# Patient Record
Sex: Male | Born: 1975
Health system: Southern US, Community
[De-identification: ages and names within clinical notes are randomized; demographics above are authoritative.]

## PROBLEM LIST (undated history)

## (undated) HISTORY — PX: BACK SURGERY: SHX140

## (undated) HISTORY — PX: ANKLE SURGERY: SHX546

---

## 2004-05-27 ENCOUNTER — Emergency Department: Payer: Self-pay | Admitting: Emergency Medicine

## 2008-01-11 ENCOUNTER — Inpatient Hospital Stay: Payer: Self-pay | Admitting: Internal Medicine

## 2008-01-11 ENCOUNTER — Other Ambulatory Visit: Payer: Self-pay

## 2009-03-21 ENCOUNTER — Emergency Department: Payer: Self-pay | Admitting: Unknown Physician Specialty

## 2009-05-09 ENCOUNTER — Emergency Department: Payer: Self-pay | Admitting: Emergency Medicine

## 2009-05-14 ENCOUNTER — Emergency Department: Payer: Self-pay

## 2009-06-03 ENCOUNTER — Emergency Department: Payer: Self-pay | Admitting: Unknown Physician Specialty

## 2009-07-09 ENCOUNTER — Emergency Department: Payer: Self-pay | Admitting: Emergency Medicine

## 2009-08-08 ENCOUNTER — Inpatient Hospital Stay: Payer: Self-pay | Admitting: Internal Medicine

## 2009-10-31 ENCOUNTER — Emergency Department: Payer: Self-pay | Admitting: Emergency Medicine

## 2010-01-28 ENCOUNTER — Emergency Department: Payer: Self-pay | Admitting: Emergency Medicine

## 2011-05-09 IMAGING — CR RIGHT HAND - COMPLETE 3+ VIEW
1 series · 3 of 3 positions shown · non-contrast
Comparison: none

REASON FOR EXAM: injury
COMMENTS:

PROCEDURE:     DXR - DXR HAND RT COMPLETE W/OBLIQUES  - May 09, 2009 [DATE]
RESULT:     Images of the right hand show no fracture, dislocation or
radiopaque foreign body.

[Series 1: view not recorded · 0.17mm/px · 3 of 3 slices shown]
[im 1/3]
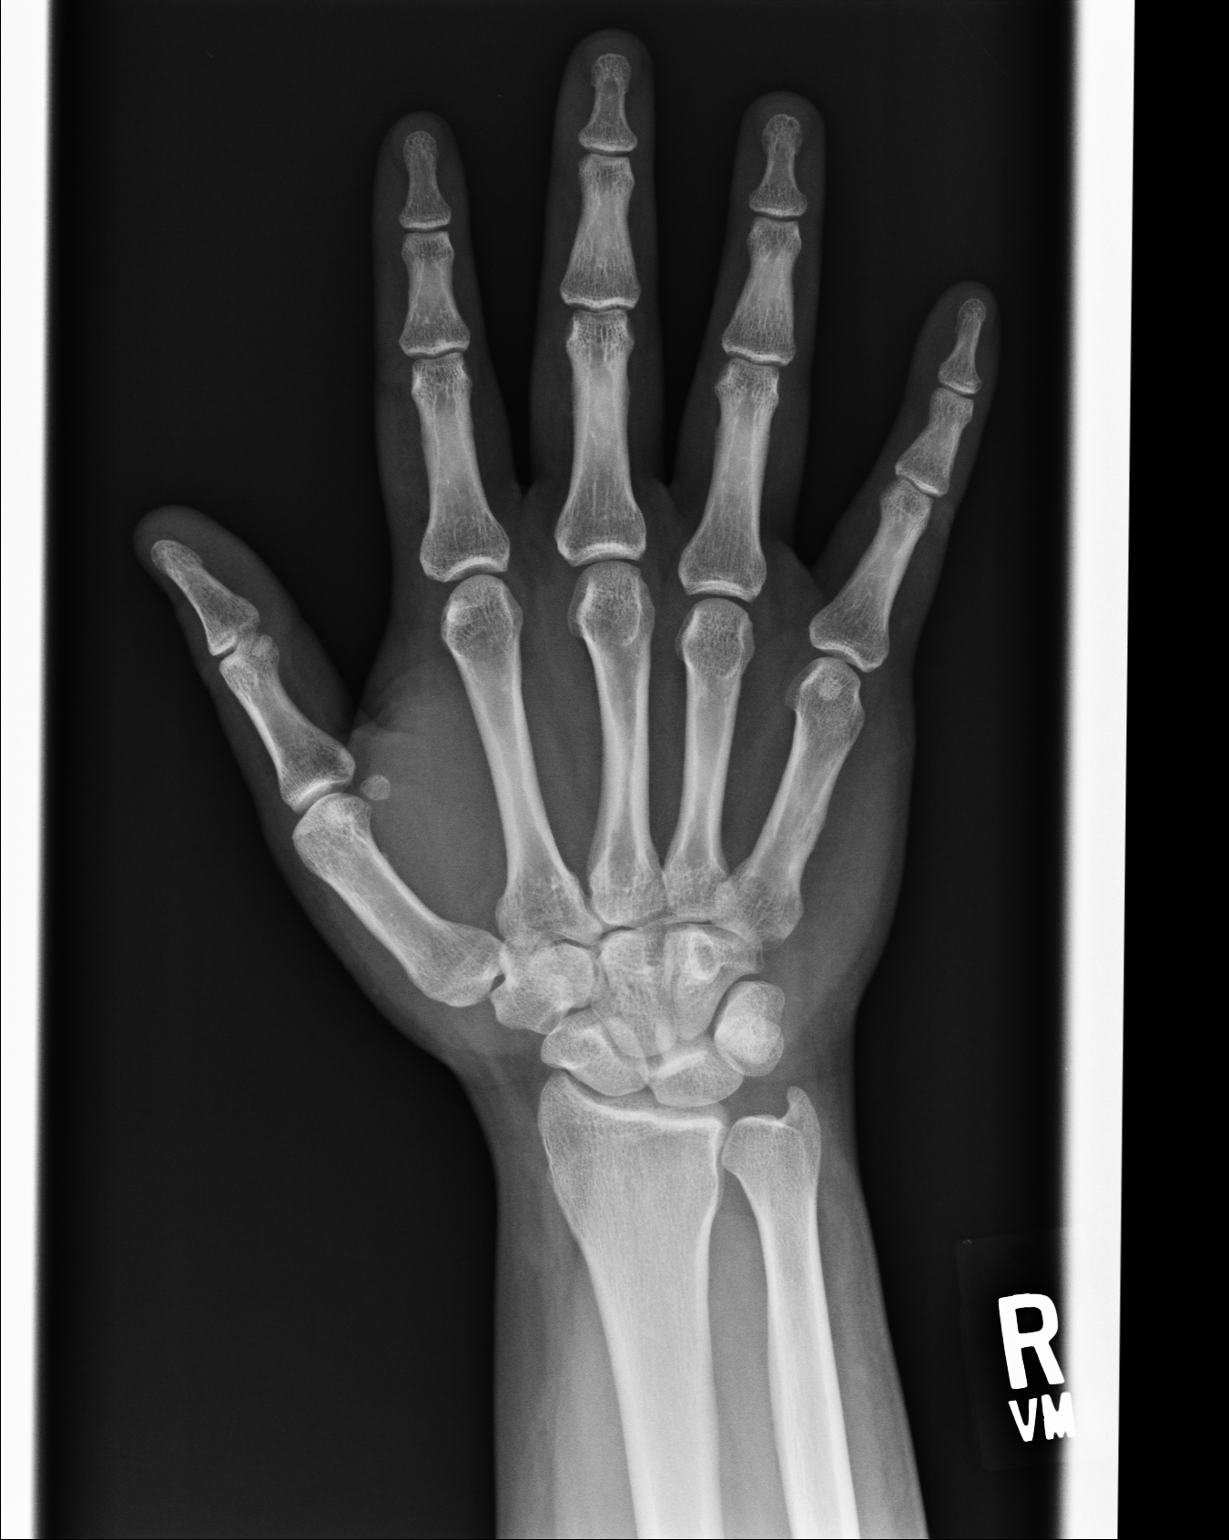
[im 2/3]
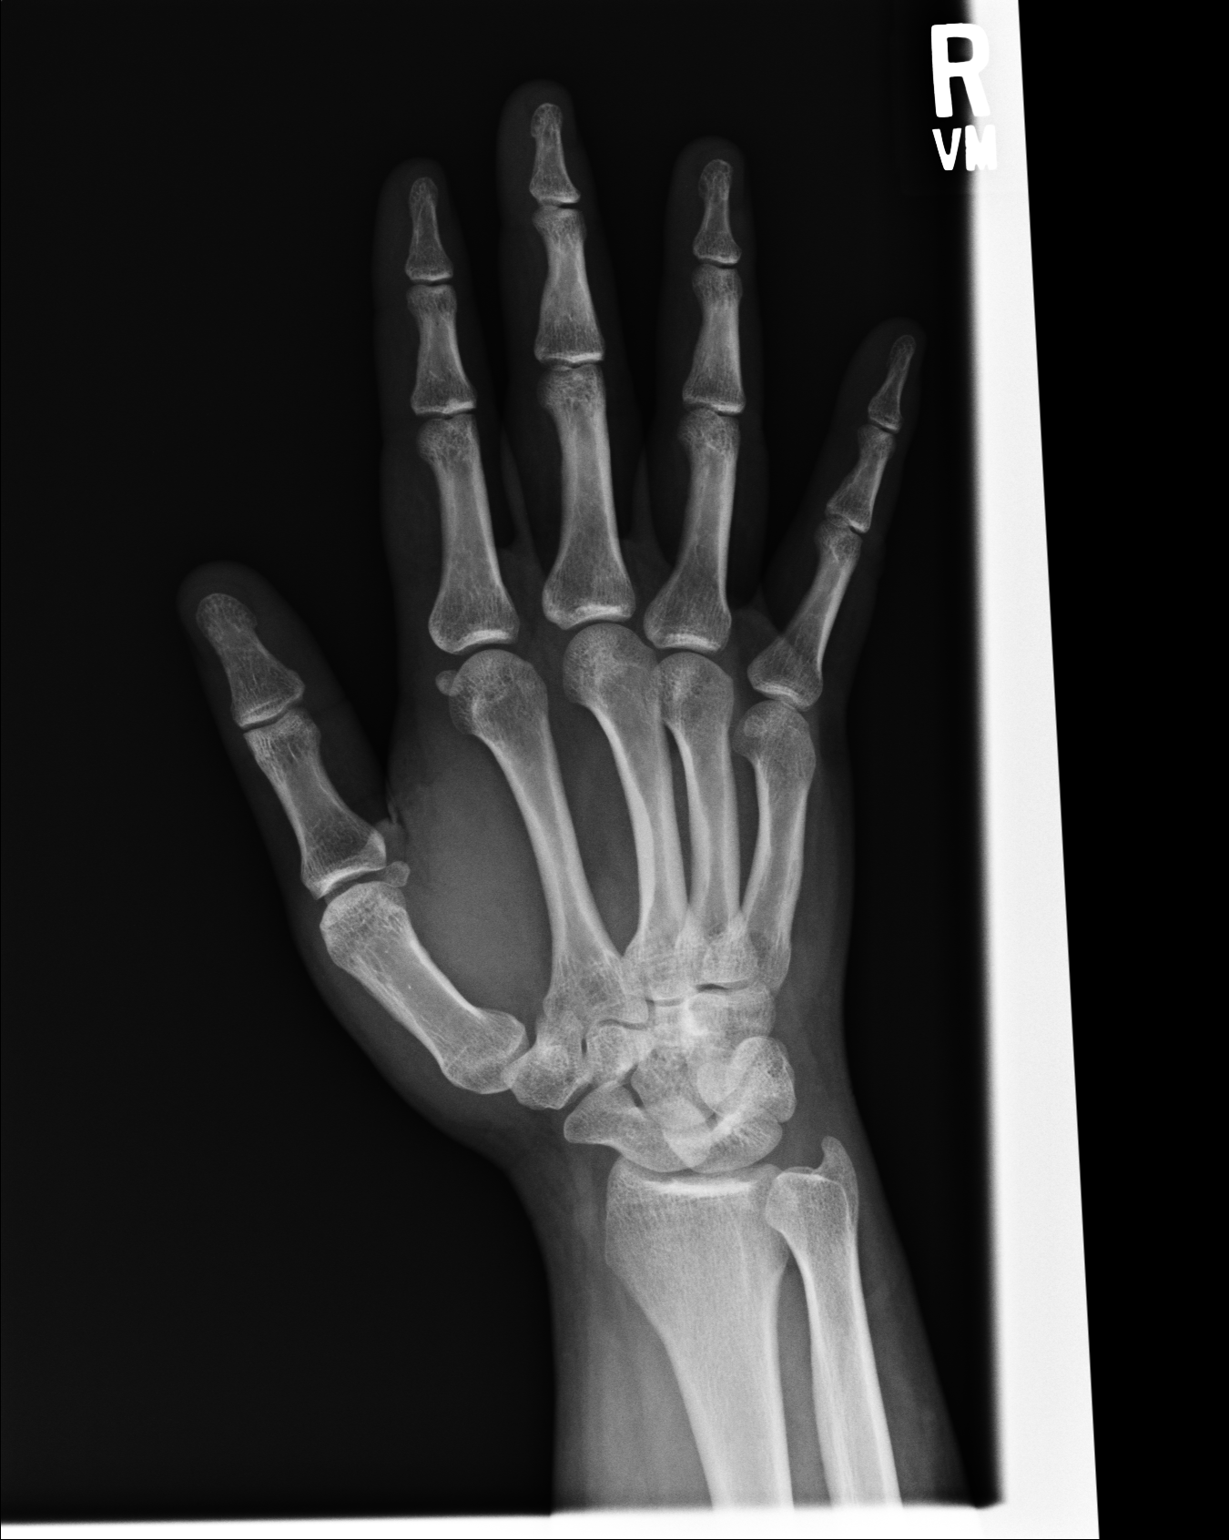
[im 3/3]
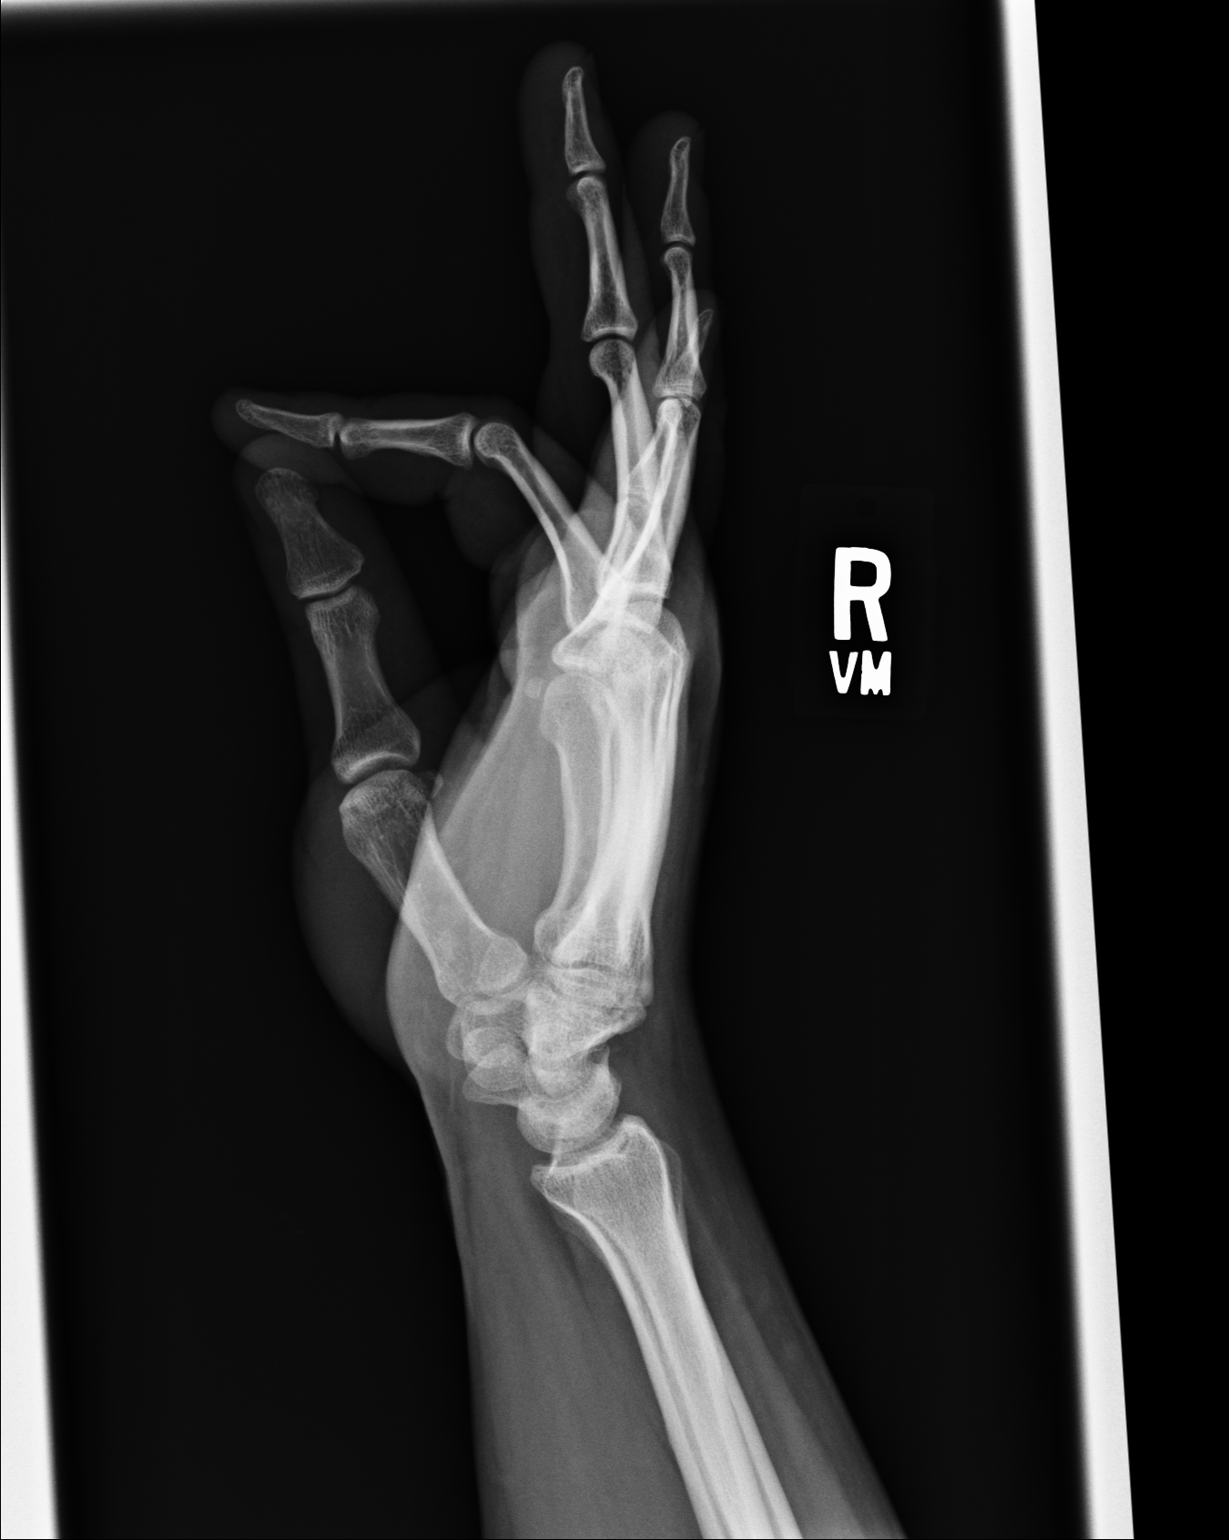

[3 of 3 positions shown; findings below may reference images not displayed]

IMPRESSION: Please see above.

## 2011-08-21 ENCOUNTER — Emergency Department: Payer: Self-pay | Admitting: Emergency Medicine

## 2011-11-26 LAB — DRUG SCREEN, URINE
Amphetamines, Ur Screen: NEGATIVE (ref ?–1000)
Barbiturates, Ur Screen: NEGATIVE (ref ?–200)
Cannabinoid 50 Ng, Ur ~~LOC~~: POSITIVE (ref ?–50)
Cocaine Metabolite,Ur ~~LOC~~: POSITIVE (ref ?–300)
MDMA (Ecstasy)Ur Screen: NEGATIVE (ref ?–500)
Methadone, Ur Screen: NEGATIVE (ref ?–300)
Opiate, Ur Screen: NEGATIVE (ref ?–300)
Phencyclidine (PCP) Ur S: NEGATIVE (ref ?–25)

## 2011-11-26 LAB — COMPREHENSIVE METABOLIC PANEL
Albumin: 4.1 g/dL (ref 3.4–5.0)
Alkaline Phosphatase: 60 U/L (ref 50–136)
Anion Gap: 11 (ref 7–16)
Bilirubin,Total: 0.6 mg/dL (ref 0.2–1.0)
Calcium, Total: 8.7 mg/dL (ref 8.5–10.1)
Creatinine: 1.15 mg/dL (ref 0.60–1.30)
EGFR (African American): >60
EGFR (Non-African Amer.): >60
SGPT (ALT): 44 U/L

## 2011-11-26 LAB — CBC
HGB: 14.4 g/dL (ref 13.0–18.0)
MCH: 28.6 pg (ref 26.0–34.0)
MCHC: 32.9 g/dL (ref 32.0–36.0)
MCV: 87 fL (ref 80–100)
RDW: 13.7 % (ref 11.5–14.5)

## 2011-11-26 LAB — ETHANOL: Ethanol %: <0.003 % (ref 0.000–0.080)

## 2011-11-26 LAB — TSH: Thyroid Stimulating Horm: 0.76 u[IU]/mL

## 2011-11-27 ENCOUNTER — Inpatient Hospital Stay: Payer: Self-pay | Admitting: Psychiatry

## 2013-03-29 ENCOUNTER — Emergency Department: Payer: Self-pay | Admitting: Emergency Medicine

## 2013-04-05 ENCOUNTER — Emergency Department: Payer: Self-pay | Admitting: Emergency Medicine

## 2013-04-05 LAB — CBC
HCT: 37.7 % — ABNORMAL LOW (ref 40.0–52.0)
HGB: 13 g/dL (ref 13.0–18.0)
MCHC: 34.4 g/dL (ref 32.0–36.0)
RBC: 4.44 10*6/uL (ref 4.40–5.90)
WBC: 10.1 10*3/uL (ref 3.8–10.6)

## 2013-04-05 LAB — BASIC METABOLIC PANEL
Anion Gap: 4 — ABNORMAL LOW (ref 7–16)
Calcium, Total: 8.8 mg/dL (ref 8.5–10.1)
Co2: 28 mmol/L (ref 21–32)
EGFR (Non-African Amer.): >60
Glucose: 86 mg/dL (ref 65–99)
Osmolality: 281 (ref 275–301)
Potassium: 3.7 mmol/L (ref 3.5–5.1)
Sodium: 141 mmol/L (ref 136–145)

## 2013-07-29 ENCOUNTER — Emergency Department: Payer: Self-pay | Admitting: Emergency Medicine

## 2013-07-30 ENCOUNTER — Emergency Department: Payer: Self-pay | Admitting: Emergency Medicine

## 2013-12-28 ENCOUNTER — Emergency Department: Payer: Self-pay | Admitting: Emergency Medicine

## 2013-12-31 ENCOUNTER — Emergency Department: Payer: Self-pay | Admitting: Internal Medicine

## 2014-01-08 ENCOUNTER — Emergency Department: Payer: Self-pay | Admitting: Emergency Medicine

## 2014-08-17 ENCOUNTER — Emergency Department: Payer: Self-pay | Admitting: Emergency Medicine

## 2014-08-17 LAB — URINALYSIS, COMPLETE
BLOOD: NEGATIVE
Bilirubin,UR: NEGATIVE
Glucose,UR: NEGATIVE mg/dL (ref 0–75)
Ketone: NEGATIVE
NITRITE: NEGATIVE
PH: 7 (ref 4.5–8.0)
Protein: NEGATIVE
RBC,UR: 7 /HPF (ref 0–5)
SPECIFIC GRAVITY: 1.018 (ref 1.003–1.030)
Squamous Epithelial: 13
WBC UR: 30 /HPF (ref 0–5)

## 2014-08-17 LAB — DRUG SCREEN, URINE
AMPHETAMINES, UR SCREEN: NEGATIVE (ref ?–1000)
Barbiturates, Ur Screen: NEGATIVE (ref ?–200)
Benzodiazepine, Ur Scrn: POSITIVE (ref ?–200)
Cannabinoid 50 Ng, Ur ~~LOC~~: POSITIVE (ref ?–50)
Cocaine Metabolite,Ur ~~LOC~~: NEGATIVE (ref ?–300)
MDMA (ECSTASY) UR SCREEN: NEGATIVE (ref ?–500)
Methadone, Ur Screen: NEGATIVE (ref ?–300)
Opiate, Ur Screen: NEGATIVE (ref ?–300)
Phencyclidine (PCP) Ur S: NEGATIVE (ref ?–25)
TRICYCLIC, UR SCREEN: NEGATIVE (ref ?–1000)

## 2014-08-17 LAB — COMPREHENSIVE METABOLIC PANEL
ALK PHOS: 77 U/L
Albumin: 3.4 g/dL (ref 3.4–5.0)
Anion Gap: 7 (ref 7–16)
BILIRUBIN TOTAL: 0.4 mg/dL (ref 0.2–1.0)
BUN: 12 mg/dL (ref 7–18)
Calcium, Total: 8.5 mg/dL (ref 8.5–10.1)
Chloride: 110 mmol/L — ABNORMAL HIGH (ref 98–107)
Co2: 25 mmol/L (ref 21–32)
Creatinine: 0.82 mg/dL (ref 0.60–1.30)
EGFR (African American): >60
EGFR (Non-African Amer.): >60
GLUCOSE: 106 mg/dL — AB (ref 65–99)
Osmolality: 283 (ref 275–301)
POTASSIUM: 3.7 mmol/L (ref 3.5–5.1)
SGOT(AST): 32 U/L (ref 15–37)
SGPT (ALT): 37 U/L
Sodium: 142 mmol/L (ref 136–145)
TOTAL PROTEIN: 6.9 g/dL (ref 6.4–8.2)

## 2014-08-17 LAB — CBC
HCT: 45.5 % (ref 40.0–52.0)
HGB: 14.8 g/dL (ref 13.0–18.0)
MCH: 28.6 pg (ref 26.0–34.0)
MCHC: 32.5 g/dL (ref 32.0–36.0)
MCV: 88 fL (ref 80–100)
PLATELETS: 203 10*3/uL (ref 150–440)
RBC: 5.18 10*6/uL (ref 4.40–5.90)
RDW: 13.7 % (ref 11.5–14.5)
WBC: 7.5 10*3/uL (ref 3.8–10.6)

## 2014-09-11 ENCOUNTER — Emergency Department: Payer: Self-pay | Admitting: Emergency Medicine

## 2014-12-06 NOTE — Consult Note (Signed)
Brief Consult Note: Diagnosis: Mood disorder NOS, anxiety d/o NOS.   Orders entered.   Comments: Will admit to BMU.  Electronic Signatures: Kristine LineaPucilowska, Tomesha Sargent (MD)  (Signed 15-Apr-13 12:11)  Authored: Brief Consult Note   Last Updated: 15-Apr-13 12:11 by Kristine LineaPucilowska, Paola Flynt (MD)

## 2014-12-22 NOTE — H&P (Signed)
PATIENT NAMMarland Kitchen:  Todd Conrad, Todd Conrad  [id number] OF BIRTH:  08/10/1976 OF ADMISSION:  11/27/2011 PHYSICIAN: Daryel NovemberJonathan Williams, MDPHYSICIAN:  Kristine LineaJolanta Zakya Halabi, MD DATA: Todd Conrad is a 39 year old male with a history of anxiety.  COMPLAINT: "I tried to hang myself."   OF PRESENT ILLNESS: Todd Conrad has been a patient of Dr. Janeece RiggersSu who prescribed Klonopin only as the patient reportedly has not been able to tolerate SSRIs. Yesterday, his girlfriend of 7 years has left him with their son. She has been supporting the patient all along. He will now lose his housing as well. She is in a new relationship already. The patient, who has been unemployed after back surgery, attempted a suicide by hanging using a belt. He reports that his anxiety had been under control when Dr. Janeece RiggersSu prescribed a generous dose of 6 mg of Klonopin a day. He is no longer able to see Dr. Janeece RiggersSu in [place]. He reports that [facility] gave him a smaller supply of Klonopin causin worsening of anxiety. He denies symptoms of depression prior to marital conflict. There is no history of psychosis. There are no symptoms suggestive of bipolar mania. He denies alcohol or illicit substance use.   PSYCHIATRIC HISTORY: There were no prior hospitalizations or suicide attempts.    HISTORY OF MENTAL ILLNESS: Multiple family members with anxiety.  HISTORY: Chronic back pain. Status post back surgery.  ON ADMISSION: Klonopin 2 mg twice daily.  PCN. HISTORY: He graduated from high school, no college. He is not been employed for several years due to back injury and has been supported by his girlfriend. Denies drinking but at times uses painkillers obtained illegally.  REVIEW OF SYSTEMS: CONSTITUTIONAL: No fevers or chills. No weight changes. EYES: No double or blurred vision. ENT: No hearing loss. RESPIRATORY: No shortness of breath or cough. CARDIOVASCULAR: No chest pain or orthopnea. GASTROINTESTINAL: No abdominal pain, nausea, vomiting, or  diarrhea. Normal bowel movements. GU: No incontinence or frequency. ENDOCRINE: No heat or cold intolerance. LYMPHATIC: No anemia or easy bruising. INTEGUMENTARY: No acne or rash. MUSCULOSKELETAL: Positive for back pain. NEUROLOGICAL: No tingling or weakness. PSYCHIATRIC: See history of present illness for details.  EXAMINATION: VITAL SIGNS: Blood pressure 131/78, pulse 81, respirations 20, temperature 95.9. GENERAL: This is a well-developed male in no acute distress. HEENT: The pupils are equal, round, and reactive to light. Sclerae are anicteric. NECK: Supple. No thyromegaly. LUNGS: Clear to auscultation. No dullness to percussion. HEART: Regular rhythm and rate. No murmurs, rubs, or gallops. ABDOMEN: Soft, nontender, and nondistended. MUSCULOSKELETAL: Normal muscle strength in all extremities. SKIN: No rashes or bruises. LYMPHATIC: No cervical adenopathy. NEUROLOGICAL: Cranial nerves II through XII are intact. Normal gait.  DIAGNOSTIC AND RADIOLOGICAL DATA: Chemistries, LFTs and CBC are within normal limits except AST 45. TSH 0.76. Blood alcohol level zero. Urine toxicology screen is positive for MJ and cocaine. Urinalysis is not suggestive of urinary tract infection.  STATUS EXAMINATION ON ADMISSION: The patient is alert and oriented to person, place, time, and situation. He is asleep in the ER. There is severe psychomotor retardation. She is wearing private clothes. He is marginally groomed. He maintains minimal eye contact. Her speech is soft. Mood is depressed with anxious affect. Thought processing is logical and goal oriented. Thought content: She denies suicidal or homicidal ideation but was admitted after a suicide attempt by hanging. There are no delusions or paranoia. There are no auditory or visual hallucinations. His cognition is grossly intact. His insight and judgment are poor.  RISK ASSESSMENT ON ADMISSION: This is a patient with a history of anxiety and substance abuse who attempted a suicide  in the context of multiple stressors.    I:  Mood disorder NOS.     Cocaine dependence.      MJ dependence. Anxiety disorder NOS.    AXIS II:  Deferred. III:  None. IV:  Substance abuse, employment, financial, housing, relationship.   V:  Global Assessment of Functioning on admission 25.  The patient was admitted to [facility] for safety, stabilization and medication management. He was initially placed on suicide precautions and was closely monitored for any unsafe behaviors.  He underwent full psychiatric and risk assessment. He received pharmacotherapy, individual and group psychotherapy, substance abuse counseling, and support from therapeutic milieu.   1. Suicidal ideations-the patient is able to contract for safety.   Mood-we will start prozac.  Anxiety-we were unable to confirm yet if the patient truly has access to benzos in the community. We will place him on Ativan taper for now to avoid symptoms of withdrawal.     Substance abuse-the patient admits to MJ but denies cocaine in spite of the evidence.    Disposition: TBE.     Electronic Signatures: Kristine LineaPucilowska, Gerene Nedd (MD)  (Signed on 16-Apr-13 00:37)  Authored  Last Updated: 16-Apr-13 00:37 by Kristine LineaPucilowska, Jaquelin Meaney (MD)

## 2015-12-31 IMAGING — CR RIGHT FOOT COMPLETE - 3+ VIEW
1 series · 3 of 3 positions shown · non-contrast
Comparison: None.

CLINICAL DATA: Forklift ran over right foot with bruising and
swelling of the foot

EXAM:
RIGHT FOOT COMPLETE - 3+ VIEW

[Series 4: x foot ap right · 0.14mm/px · 3 of 3 slices shown]
[im 1/3]
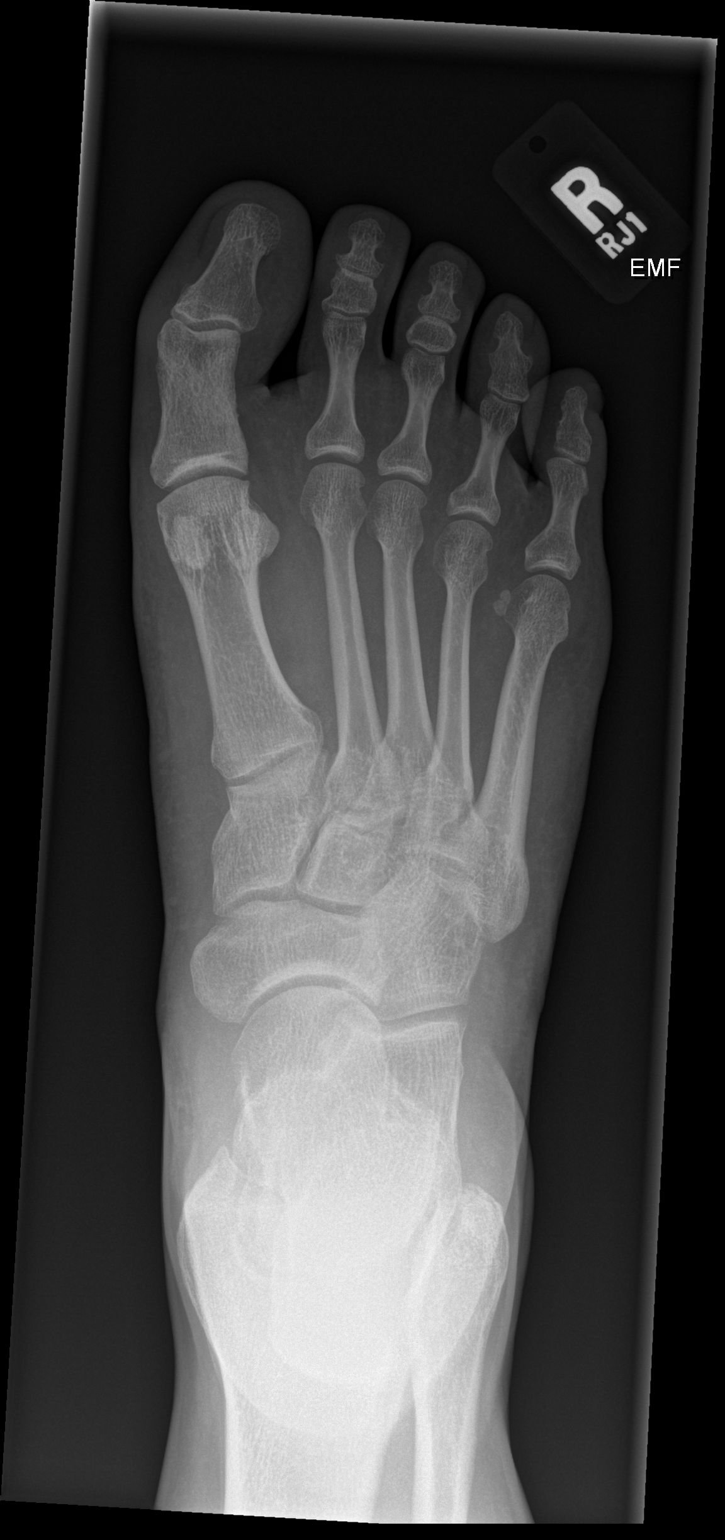
[im 2/3]
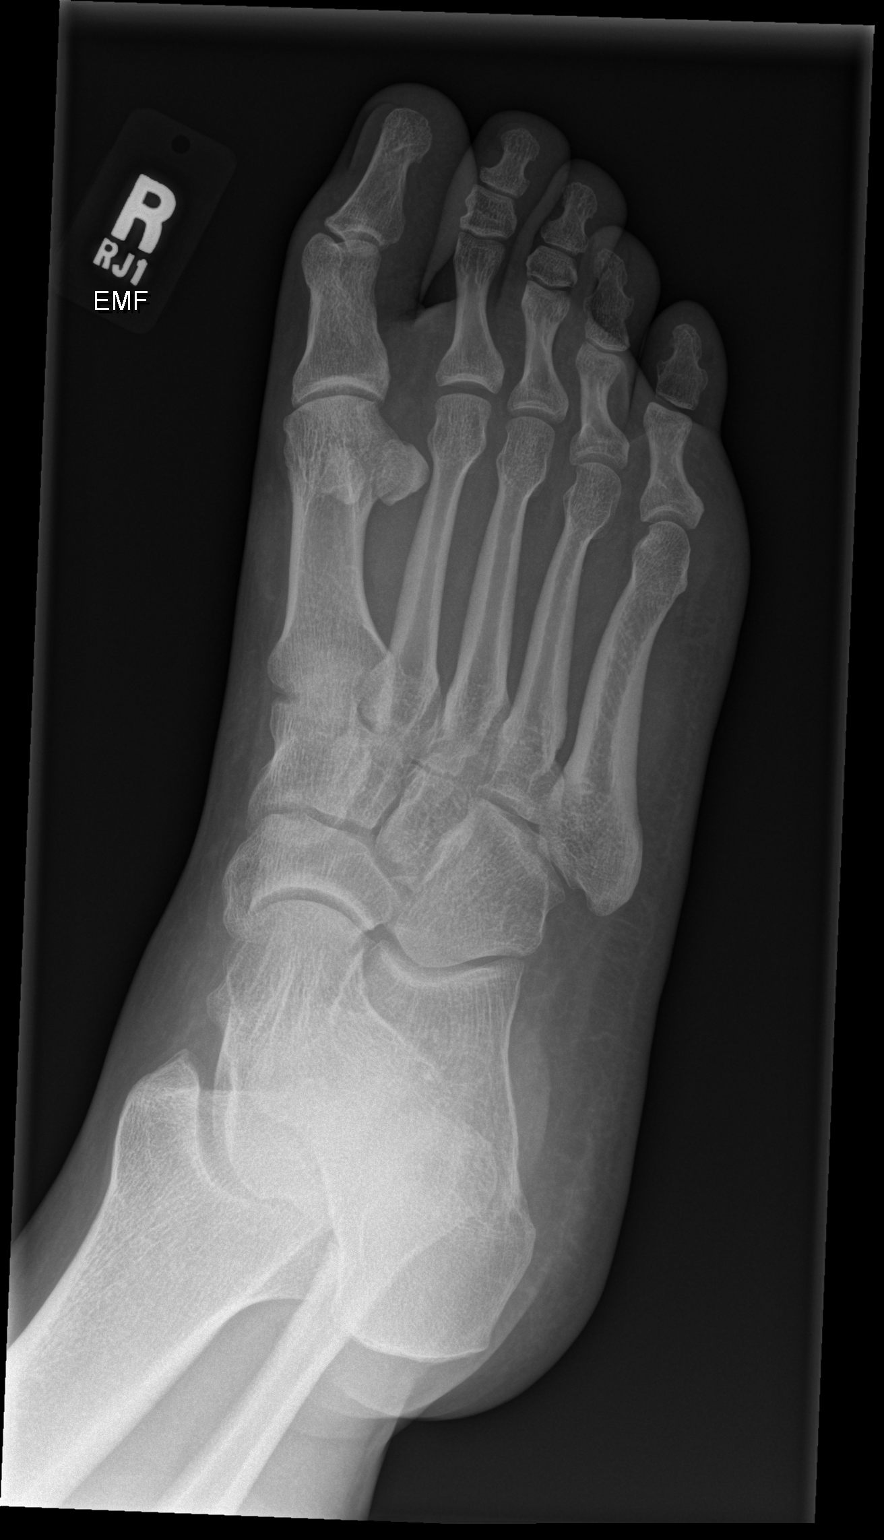
[im 3/3]
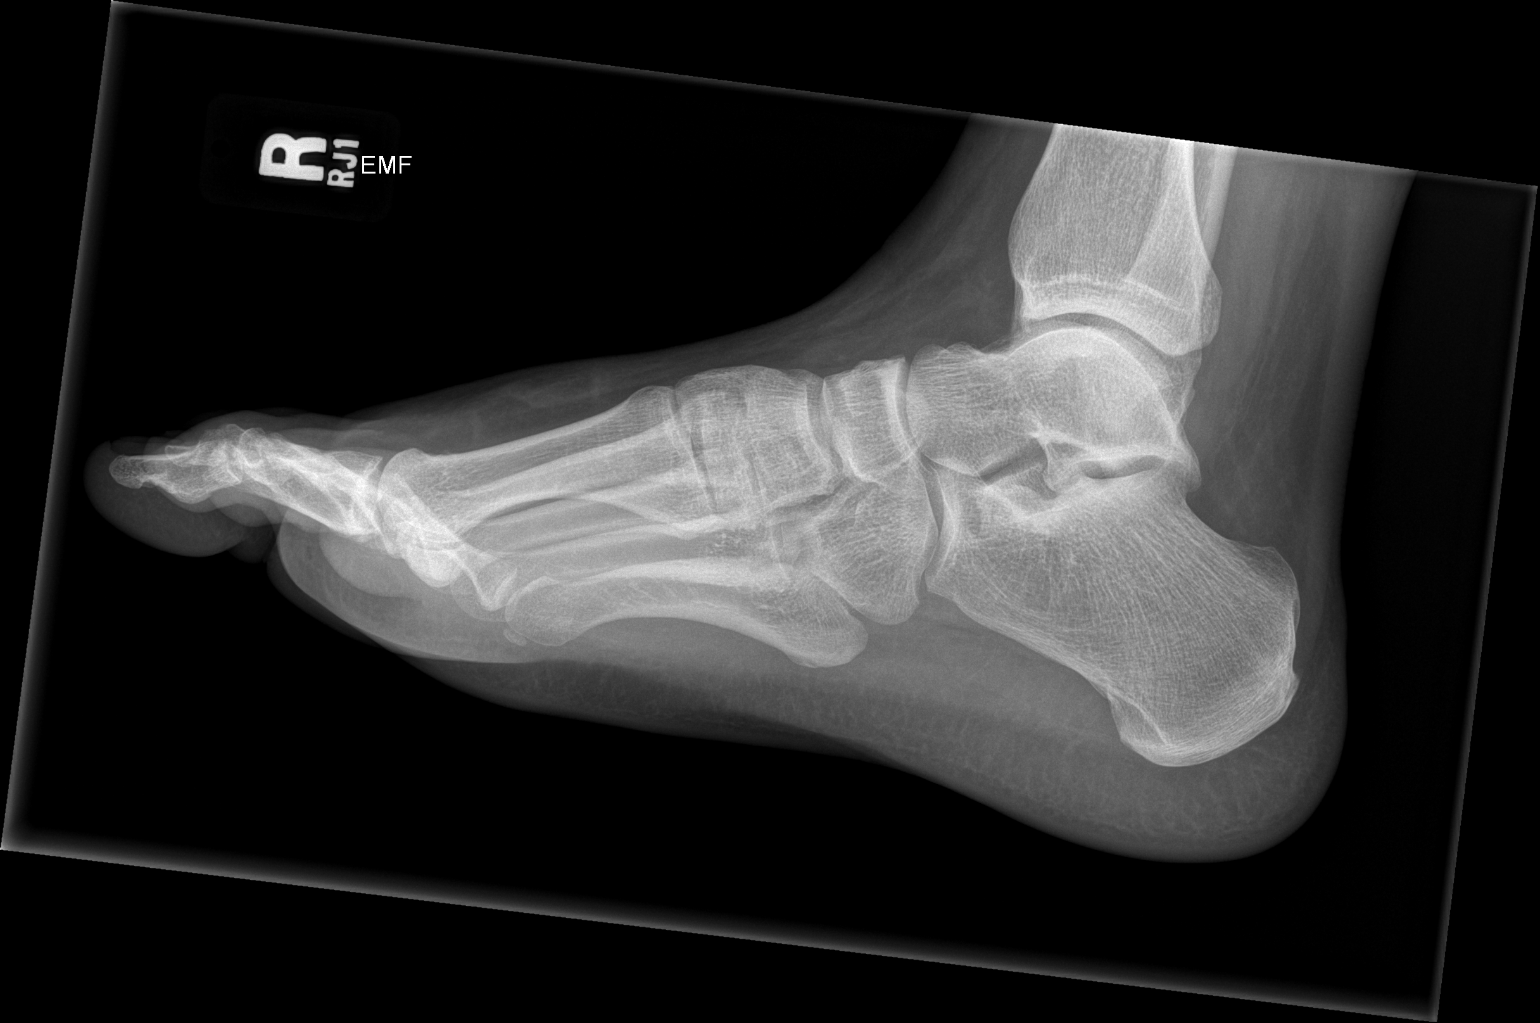

[3 of 3 positions shown; findings below may reference images not displayed]

FINDINGS: There is no evidence of fracture or dislocation. There is no
evidence of arthropathy or other focal bone abnormality. Soft
tissues are unremarkable.
IMPRESSION: No acute fracture or dislocation.

## 2016-08-16 IMAGING — US US SCROTUM W/ DOPPLER COMPLETE
1 series · 14 of 25 positions shown · non-contrast
Comparison: None.

CLINICAL DATA: Left testicular pain for the past 3 days. The
patient reports a lot of pulling and straining a week ago on the
job.

EXAM:
SCROTAL ULTRASOUND
DOPPLER ULTRASOUND OF THE TESTICLES
TECHNIQUE: Complete ultrasound examination of the testicles, epididymis, and
other scrotal structures was performed. Color and spectral Doppler
ultrasound were also utilized to evaluate blood flow to the
testicles.

[Series 1: us scrotum w/ doppler complete · 0.07mm/px · 14 of 60 slices shown]
[im 1/60]
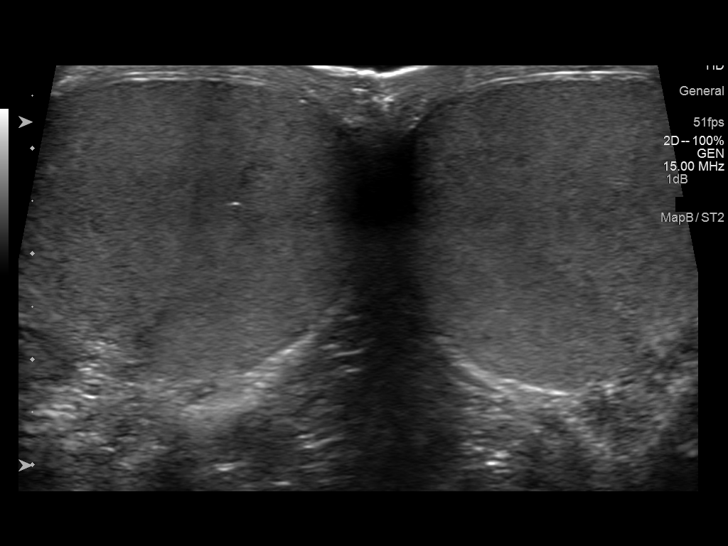
[im 5/60]
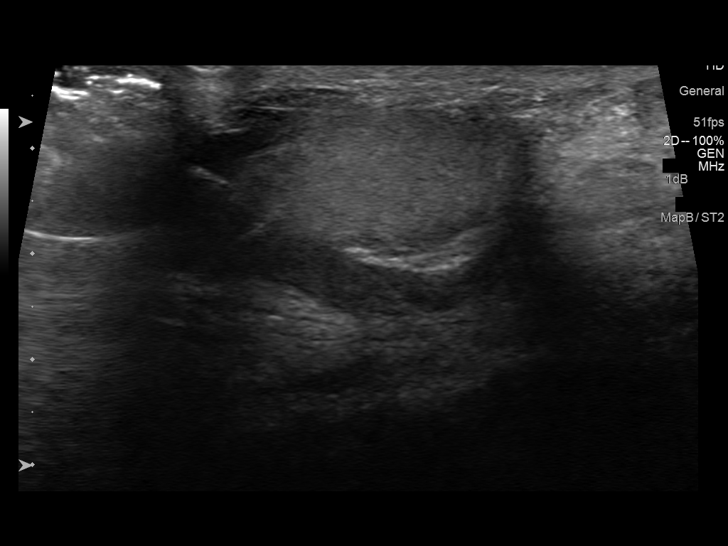
[im 10/60]
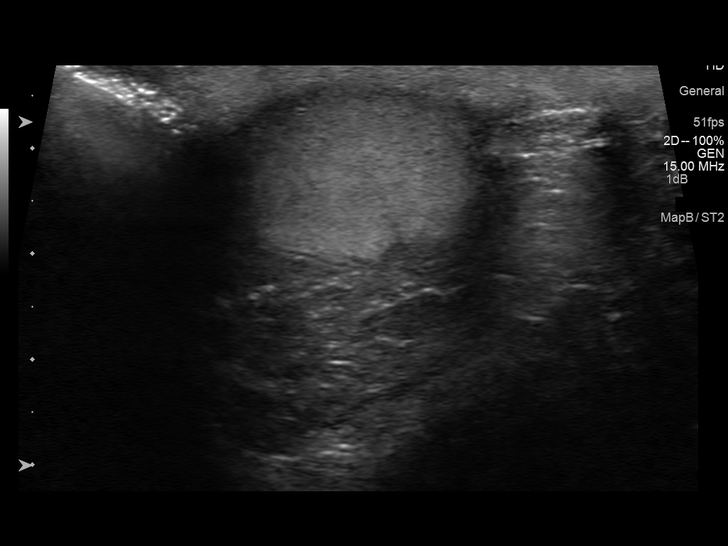
[im 15/60]
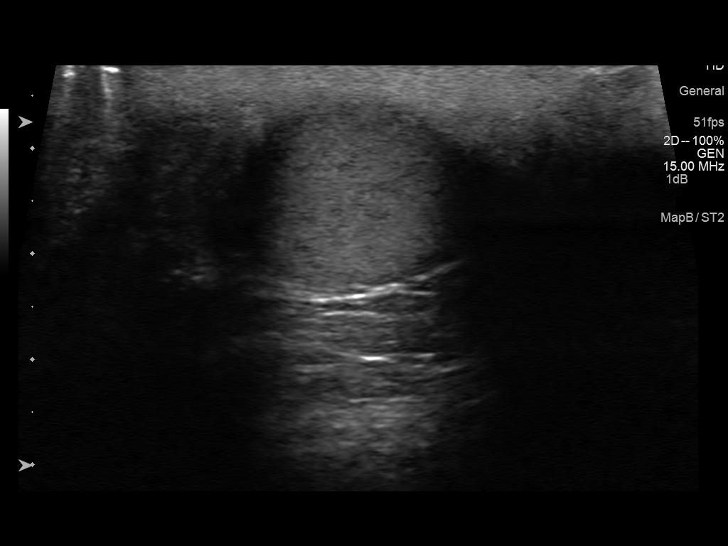
[im 20/60]
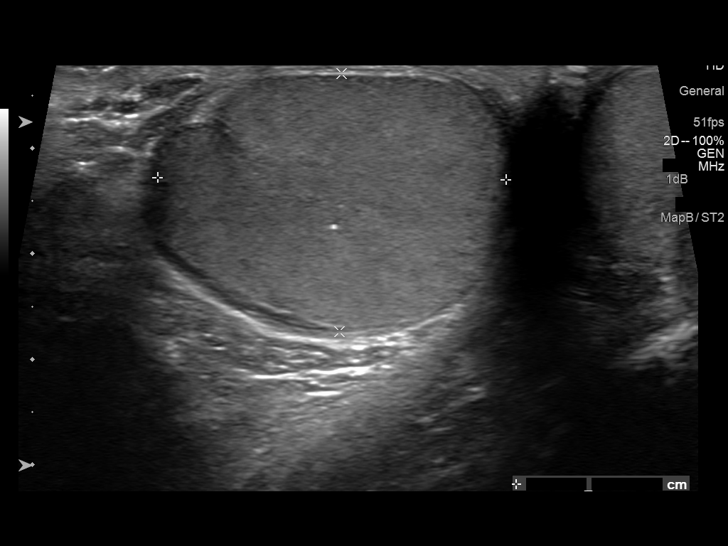
[im 23/60]
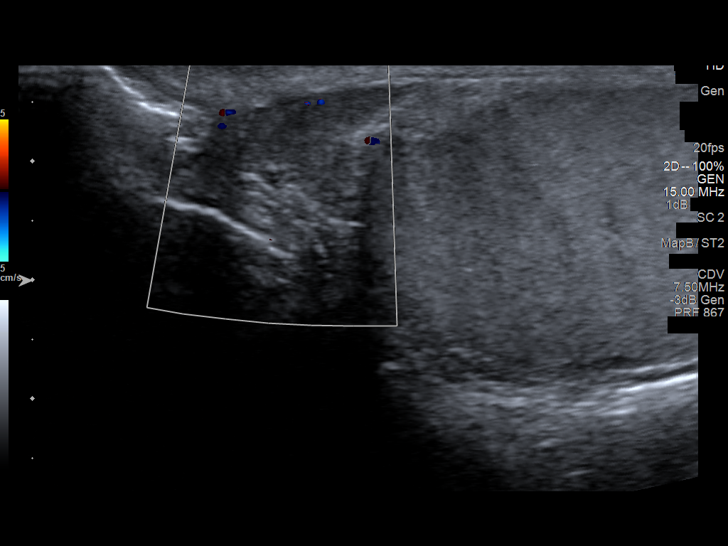
[im 28/60]
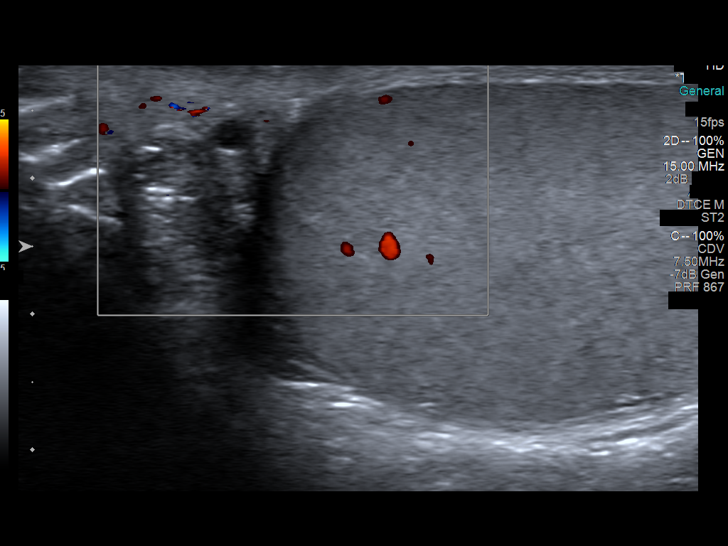
[im 32/60]
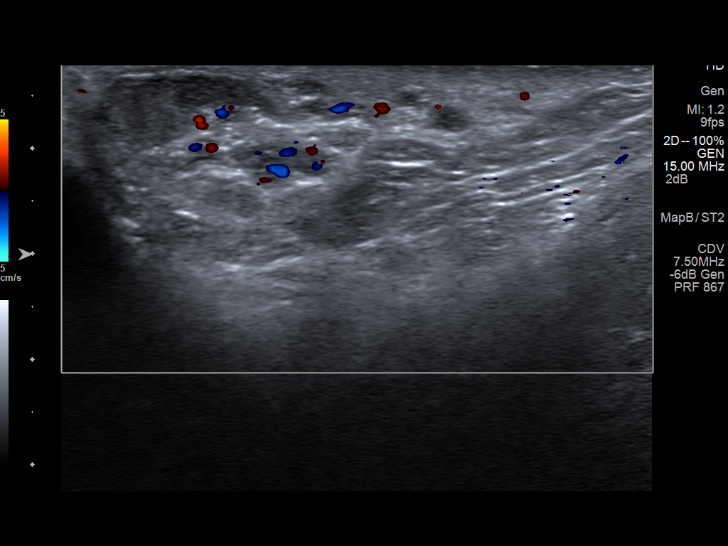
[im 37/60]
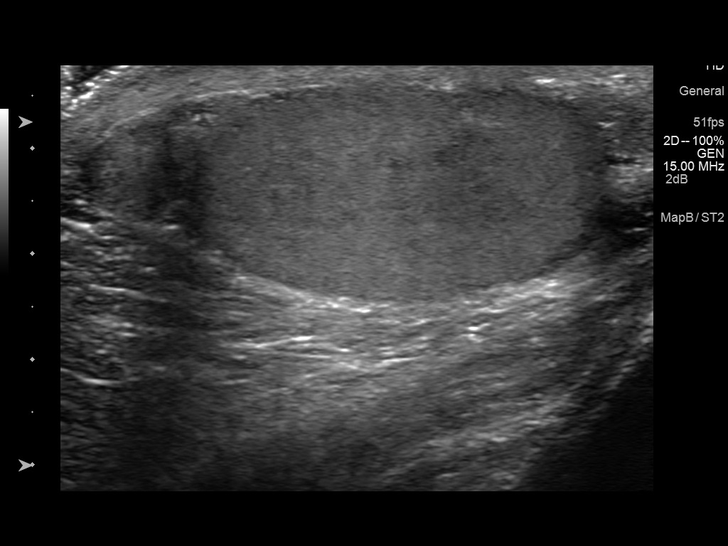
[im 40/60]
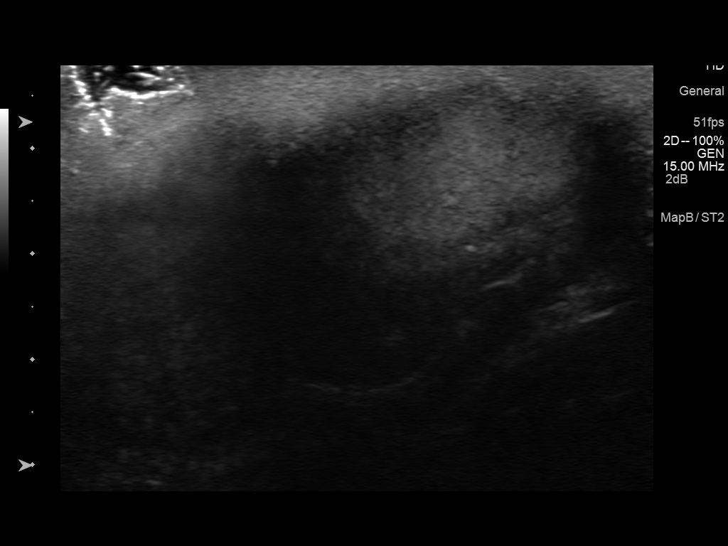
[im 45/60]
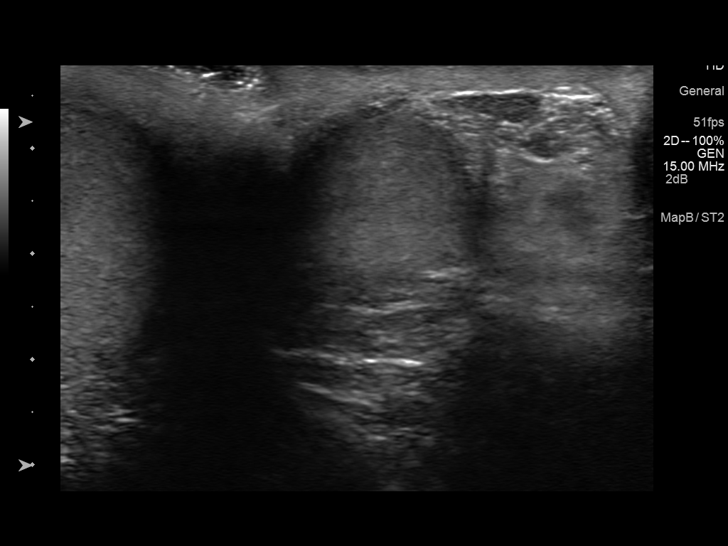
[im 50/60]
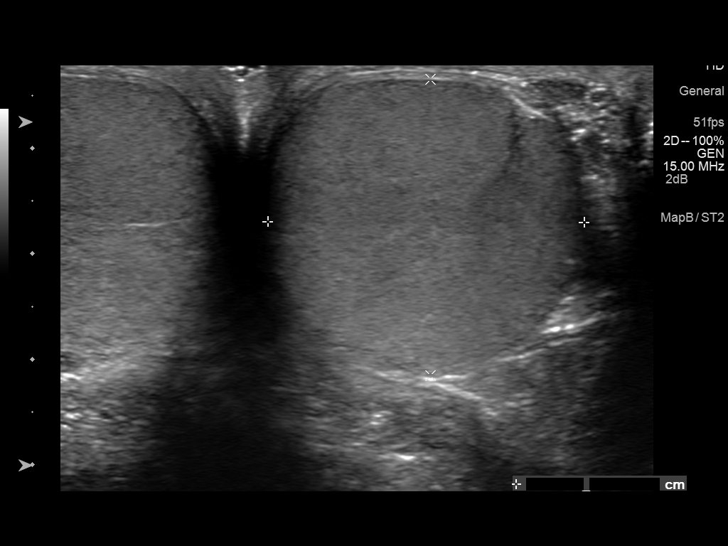
[im 55/60]
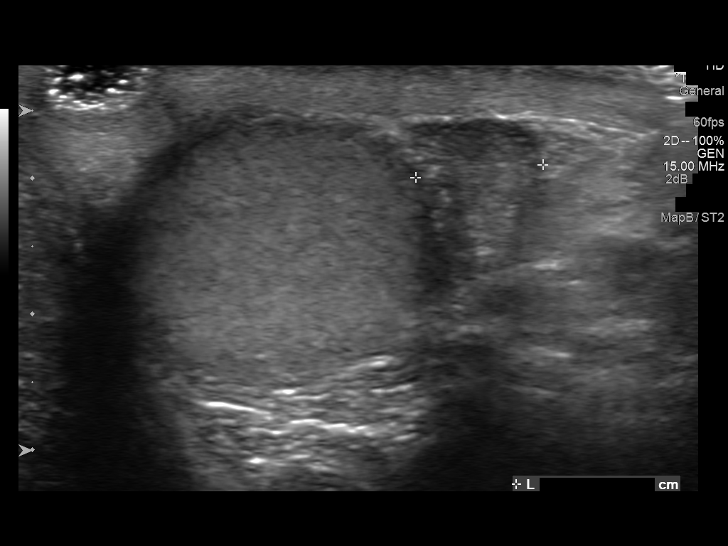
[im 60/60]
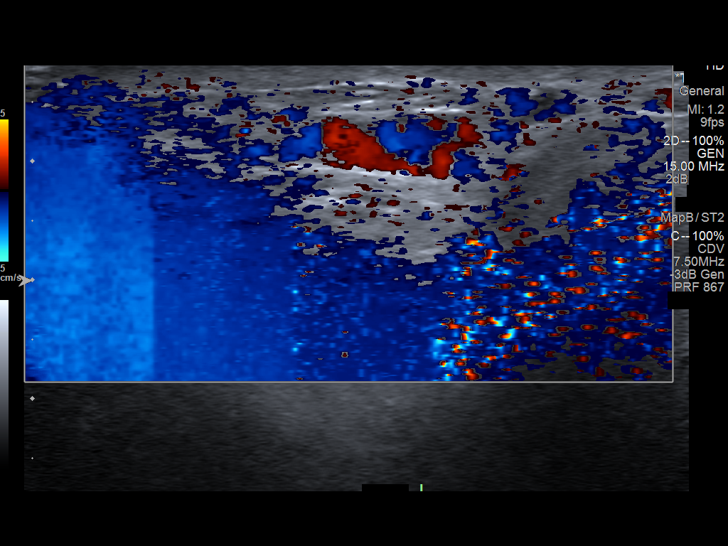

[14 of 25 positions shown; findings below may reference images not displayed]

FINDINGS: Right testicle

Measurements: 4.7 x 3.3 x 2.5 cm. No mass or microlithiasis
visualized.

Left testicle

Measurements: 4.6 x 3.0 x 2.8 cm. No mass or microlithiasis
visualized.

Right epididymis: 3 mm cyst in the head of the epididymis.
Otherwise, normal.

Left epididymis:  Normal in size and appearance.

Hydrocele:  None visualized.

Varicocele:  None visualized.

Pulsed Doppler interrogation of both testes demonstrates low
resistance arterial and venous waveforms bilaterally.
IMPRESSION: 3 mm right epididymal cyst.  Otherwise, normal examination.

## 2020-09-16 ENCOUNTER — Other Ambulatory Visit: Payer: Self-pay

## 2020-09-16 ENCOUNTER — Ambulatory Visit
Admission: EM | Admit: 2020-09-16 | Discharge: 2020-09-16 | Disposition: A | Discharge status: Home / Self Care | Payer: BC Managed Care – PPO | Attending: Family Medicine | Admitting: Family Medicine

## 2020-09-16 DIAGNOSIS — U071 COVID-19: Secondary | ICD-10-CM | POA: Diagnosis not present

## 2020-09-16 DIAGNOSIS — B349 Viral infection, unspecified: Secondary | ICD-10-CM | POA: Diagnosis not present

## 2020-09-16 DIAGNOSIS — R509 Fever, unspecified: Secondary | ICD-10-CM | POA: Insufficient documentation

## 2020-09-16 DIAGNOSIS — R059 Cough, unspecified: Secondary | ICD-10-CM | POA: Diagnosis not present

## 2020-09-16 NOTE — Discharge Instructions (Addendum)
Isolate pending the results of your COVID test.  If your test is positive you will have to quarantine for 5 days from when your symptoms started.  After 5 days you can break quarantine if your symptoms have improved and you have not had a fever for 24 hours.  Use over-the-counter Tylenol and ibuprofen as needed for body aches and fever.  Use over-the-counter cough suppressant such as Delsym or Zarbee's as needed for cough.  If you develop shortness of breath-especially at rest, you are unable to speak in full sentences, or is a late sign your lips start turning blue you need go the ER for evaluation.

## 2020-09-16 NOTE — ED Triage Notes (Signed)
Patient complains of cough, fever, body aches x Monday. States that son and wife are also both positive.

## 2020-09-16 NOTE — ED Provider Notes (Signed)
[facility]    CSN: [id number] Arrival date & time: 09/16/20  1533      History   Chief Complaint Chief Complaint  Patient presents with  . Fever    HPI Todd Conrad. is a 45 y.o. male.   HPI   45 year old male here for evaluation of active fever, body aches, and cough that been going on for the past 3 days.  Patient state he has had no other associated symptoms.  He has not checked his fever at home so is unsure of what his temperature has been.  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  Patient reports that his son and his wife are both Covid positive and he is not vaccinating his COVID.  History reviewed. No pertinent past medical history.  There are no problems to display for this patient.   Past Surgical History:  Procedure Laterality Date  . BACK SURGERY         Home Medications    Prior to Admission medications   Not on File    Family History Family History  Problem Relation Age of Onset  . Healthy Mother   . Healthy Father     Social History Social History   Tobacco Use  . Smoking status: Never Smoker  . Smokeless tobacco: Never Used  Vaping Use  . Vaping Use: Every day  Substance Use Topics  . Alcohol use: Not Currently  . Drug use: Not Currently     Allergies   Patient has no known allergies.   Review of Systems Review of Systems  Constitutional: Positive for fever. Negative for activity change and appetite change.  HENT: Negative for congestion, ear pain, rhinorrhea and sore throat.   Respiratory: Positive for cough. Negative for shortness of breath and wheezing.   Musculoskeletal: Positive for arthralgias and myalgias.  Skin: Negative for rash.  Hematological: Negative.   Psychiatric/Behavioral: Negative.      Physical Exam Triage Vital Signs ED Triage Vitals  Enc Vitals Group     BP 09/16/20 1558 139/90     Pulse Rate 09/16/20 1558 84      Resp 09/16/20 1558 17     Temp 09/16/20 1558 98.9 F (37.2 C)     Temp src --      SpO2 09/16/20 1558 99 %     Weight 09/16/20 1556 300 lb (136.1 kg)     Height 09/16/20 1556 6\' 2"  (1.88 m)     Head Circumference --      Peak Flow --      Pain Score --      Pain Loc --      Pain Edu? --      Excl. in GC? --    No data found.  Updated Vital Signs BP 139/90 (BP Location: Left Arm)   Pulse 84   Temp 98.9 F (37.2 C)   Resp 17   Ht 6\' 2"  (1.88 m)   Wt 300 lb (136.1 kg)   SpO2 99%   BMI 38.52 kg/m   Visual Acuity Right Eye Distance:   Left Eye Distance:   Bilateral Distance:    Right Eye Near:   Left Eye Near:    Bilateral Near:     Physical Exam Vitals and nursing note reviewed.  Constitutional:      General: He is not in acute distress.    Appearance:  Normal appearance. He is obese. He is not ill-appearing.  HENT:     Head: Normocephalic and atraumatic.     Right Ear: Tympanic membrane, ear canal and external ear normal.     Left Ear: Tympanic membrane, ear canal and external ear normal.     Nose: Nose normal. No congestion.     Mouth/Throat:     Mouth: Mucous membranes are moist.     Pharynx: Oropharynx is clear. No oropharyngeal exudate or posterior oropharyngeal erythema.  Cardiovascular:     Rate and Rhythm: Normal rate and regular rhythm.     Pulses: Normal pulses.     Heart sounds: Normal heart sounds. No murmur heard. No gallop.   Pulmonary:     Effort: Pulmonary effort is normal.     Breath sounds: Normal breath sounds. No wheezing, rhonchi or rales.  Skin:    General: Skin is warm and dry.     Capillary Refill: Capillary refill takes less than 2 seconds.     Findings: No erythema or rash.  Neurological:     General: No focal deficit present.     Mental Status: He is alert and oriented to person, place, and time.  Psychiatric:        Mood and Affect: Mood normal.        Behavior: Behavior normal.        Thought Content: Thought content normal.         Judgment: Judgment normal.      UC Treatments / Results  Labs (all labs ordered are listed, but only abnormal results are displayed) Labs Reviewed  SARS CORONAVIRUS 2 (TAT 6-24 HRS)    EKG   Radiology No results found.  Procedures Procedures (including critical care time)  Medications Ordered in UC Medications - No data to display  Initial Impression / Assessment and Plan / UC Course  I have reviewed the triage vital signs and the nursing notes.  Pertinent labs & imaging results that were available during my care of the patient were reviewed by me and considered in my medical decision making (see chart for details).   Patient is here for Covid testing after being exposed at home.  Patient reports that he developed symptoms of a fever, body aches, and cough 3 days ago.  He has no other associated symptoms.  He is nontoxic in appearance.  Will swab patient for COVID and have him continue his isolation..   Final Clinical Impressions(s) / UC Diagnoses   Final diagnoses:  Viral illness     Discharge Instructions     Isolate pending the results of your COVID test.  If your test is positive you will have to quarantine for 5 days from when your symptoms started.  After 5 days you can break quarantine if your symptoms have improved and you have not had a fever for 24 hours.  Use over-the-counter Tylenol and ibuprofen as needed for body aches and fever.  Use over-the-counter cough suppressant such as Delsym or Zarbee's as needed for cough.  If you develop shortness of breath-especially at rest, you are unable to speak in full sentences, or is a late sign your lips start turning blue you need go the ER for evaluation.    ED Prescriptions    None     PDMP not reviewed this encounter.   Becky Augusta, NP 09/16/20 [phone]

## 2020-09-17 ENCOUNTER — Telehealth (HOSPITAL_COMMUNITY): Payer: Self-pay | Admitting: Emergency Medicine

## 2020-09-17 LAB — SARS CORONAVIRUS 2 (TAT 6-24 HRS): SARS Coronavirus 2: POSITIVE — AB

## 2020-09-17 NOTE — Telephone Encounter (Signed)
Patient called in for results, verified identity using two identifiers, and provided positive result.  Patient did not want to update his numbers, and did not have any questions at this time

## 2020-11-26 ENCOUNTER — Ambulatory Visit: Payer: Self-pay | Admitting: Nurse Practitioner

## 2020-11-29 ENCOUNTER — Other Ambulatory Visit: Payer: Self-pay

## 2020-11-29 ENCOUNTER — Encounter: Payer: Self-pay | Admitting: Nurse Practitioner

## 2020-11-29 ENCOUNTER — Ambulatory Visit (INDEPENDENT_AMBULATORY_CARE_PROVIDER_SITE_OTHER): Payer: BC Managed Care – PPO | Admitting: Nurse Practitioner

## 2020-11-29 VITALS — BP 134/85 | HR 89 | Temp 98.3°F | Ht 73.5 in | Wt 331.8 lb

## 2020-11-29 DIAGNOSIS — R0683 Snoring: Secondary | ICD-10-CM

## 2020-11-29 DIAGNOSIS — R030 Elevated blood-pressure reading, without diagnosis of hypertension: Secondary | ICD-10-CM

## 2020-11-29 DIAGNOSIS — Z7689 Persons encountering health services in other specified circumstances: Secondary | ICD-10-CM | POA: Diagnosis not present

## 2020-11-29 NOTE — Assessment & Plan Note (Signed)
Will order home sleep study and follow up based on results.

## 2020-11-29 NOTE — Assessment & Plan Note (Signed)
Blood pressure 134/85 today. Discussed healthy diet, exercise, and weight loss. He can monitor his blood pressure at home a few times a week and call if blood pressure remains >140/90. Follow-up at physical next month.

## 2020-11-29 NOTE — Assessment & Plan Note (Signed)
BMI 43.18 today. Discussed healthy diet and exercise. He states he knows what to do, he just has to "get his head right" about it. Goal would be to lose 1-2 pounds per week.

## 2020-11-29 NOTE — Patient Instructions (Addendum)
Healthy Eating Following a healthy eating pattern may help you to achieve and maintain a healthy body weight, reduce the risk of chronic disease, and live a long and productive life. It is important to follow a healthy eating pattern at an appropriate calorie level for your body. Your nutritional needs should be met primarily through food by choosing a variety of nutrient-rich foods. What are tips for following this plan? Reading food labels  Read labels and choose the following: ? Reduced or low sodium. ? Juices with 100% fruit juice. ? Foods with low saturated fats and high polyunsaturated and monounsaturated fats. ? Foods with whole grains, such as whole wheat, cracked wheat, brown rice, and wild rice. ? Whole grains that are fortified with folic acid. This is recommended for women who are pregnant or who want to become pregnant.  Read labels and avoid the following: ? Foods with a lot of added sugars. These include foods that contain brown sugar, corn sweetener, corn syrup, dextrose, fructose, glucose, high-fructose corn syrup, honey, invert sugar, lactose, malt syrup, maltose, molasses, raw sugar, sucrose, trehalose, or turbinado sugar.  Do not eat more than the following amounts of added sugar per day:  6 teaspoons (25 g) for women.  9 teaspoons (38 g) for men. ? Foods that contain processed or refined starches and grains. ? Refined grain products, such as white flour, degermed cornmeal, white bread, and white rice. Shopping  Choose nutrient-rich snacks, such as vegetables, whole fruits, and nuts. Avoid high-calorie and high-sugar snacks, such as potato chips, fruit snacks, and candy.  Use oil-based dressings and spreads on foods instead of solid fats such as butter, stick margarine, or cream cheese.  Limit pre-made sauces, mixes, and "instant" products such as flavored rice, instant noodles, and ready-made pasta.  Try more plant-protein sources, such as tofu, tempeh, black beans,  edamame, lentils, nuts, and seeds.  Explore eating plans such as the Mediterranean diet or vegetarian diet. Cooking  Use oil to saut or stir-fry foods instead of solid fats such as butter, stick margarine, or lard.  Try baking, boiling, grilling, or broiling instead of frying.  Remove the fatty part of meats before cooking.  Steam vegetables in water or broth. Meal planning  At meals, imagine dividing your plate into fourths: ? One-half of your plate is fruits and vegetables. ? One-fourth of your plate is whole grains. ? One-fourth of your plate is protein, especially lean meats, poultry, eggs, tofu, beans, or nuts.  Include low-fat dairy as part of your daily diet.   Lifestyle  Choose healthy options in all settings, including home, work, school, restaurants, or stores.  Prepare your food safely: ? Wash your hands after handling raw meats. ? Keep food preparation surfaces clean by regularly washing with hot, soapy water. ? Keep raw meats separate from ready-to-eat foods, such as fruits and vegetables. ? Cook seafood, meat, poultry, and eggs to the recommended internal temperature. ? Store foods at safe temperatures. In general:  Keep cold foods at 7F (4.4C) or below.  Keep hot foods at 17F (60C) or above.  Keep your freezer at [facility] (-17.8C) or below.  Foods are no longer safe to eat when they have been between the temperatures of 40-17F (4.4-60C) for more than 2 hours. What foods should I eat? Fruits Aim to eat 2 cup-equivalents of fresh, canned (in natural juice), or frozen fruits each day. Examples of 1 cup-equivalent of fruit include 1 small apple, 8 large strawberries, 1 cup canned fruit,  cup dried fruit, or 1 cup 100% juice. Vegetables Aim to eat 2-3 cup-equivalents of fresh and frozen vegetables each day, including different varieties and colors. Examples of 1 cup-equivalent of vegetables include 2 medium carrots, 2 cups raw, leafy greens, 1 cup chopped  vegetable (raw or cooked), or 1 medium baked potato. Grains Aim to eat 6 ounce-equivalents of whole grains each day. Examples of 1 ounce-equivalent of grains include 1 slice of bread, 1 cup ready-to-eat cereal, 3 cups popcorn, or  cup cooked rice, pasta, or cereal. Meats and other proteins Aim to eat 5-6 ounce-equivalents of protein each day. Examples of 1 ounce-equivalent of protein include 1 egg, 1/2 cup nuts or seeds, or 1 tablespoon (16 g) peanut butter. A cut of meat or fish that is the size of a deck of cards is about 3-4 ounce-equivalents.  Of the protein you eat each week, try to have at least 8 ounces come from seafood. This includes salmon, trout, herring, and anchovies. Dairy Aim to eat 3 cup-equivalents of fat-free or low-fat dairy each day. Examples of 1 cup-equivalent of dairy include 1 cup (240 mL) milk, 8 ounces (250 g) yogurt, 1 ounces (44 g) natural cheese, or 1 cup (240 mL) fortified soy milk. Fats and oils  Aim for about 5 teaspoons (21 g) per day. Choose monounsaturated fats, such as canola and olive oils, avocados, peanut butter, and most nuts, or polyunsaturated fats, such as sunflower, corn, and soybean oils, walnuts, pine nuts, sesame seeds, sunflower seeds, and flaxseed. Beverages  Aim for six 8-oz glasses of water per day. Limit coffee to three to five 8-oz cups per day.  Limit caffeinated beverages that have added calories, such as soda and energy drinks.  Limit alcohol intake to no more than 1 drink a day for nonpregnant women and 2 drinks a day for men. One drink equals 12 oz of beer (355 mL), 5 oz of wine (148 mL), or 1 oz of hard liquor (44 mL). Seasoning and other foods  Avoid adding excess amounts of salt to your foods. Try flavoring foods with herbs and spices instead of salt.  Avoid adding sugar to foods.  Try using oil-based dressings, sauces, and spreads instead of solid fats. This information is based on general [place] nutrition guidelines. For more  information, visit choosemyplate.gov. Exact amounts may vary based on your nutrition needs. Summary  A healthy eating plan may help you to maintain a healthy weight, reduce the risk of chronic diseases, and stay active throughout your life.  Plan your meals. Make sure you eat the right portions of a variety of nutrient-rich foods.  Try baking, boiling, grilling, or broiling instead of frying.  Choose healthy options in all settings, including home, work, school, restaurants, or stores. This information is not intended to replace advice given to you by your health care provider. Make sure you discuss any questions you have with your health care provider. Document Revised: 11/12/2017 Document Reviewed: 11/12/2017 Elsevier Patient Education  2021 [employer]  

## 2020-11-29 NOTE — Progress Notes (Signed)
New Patient Office Visit  Subjective:  Patient ID: Todd Kreitzer., male    DOB: 05/06/1976  Age: 45 y.o. MRN: [id number]  CC:  Chief Complaint  Patient presents with  . 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     HPI Todd Huckeby. presents for new patient visit to establish care.  Introduced to Designer, jewellery role and practice setting.  All questions answered.  Discussed provider/patient relationship and expectations.  WEIGHT GAIN  He has had fluctuations with his weight over the years. This is the heaviest he has ever been. He knows that if he just eats right and exercises, he could lose weight. 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 He endorses snoring. Denies daytime fatigue, trouble sleeping at night.  Last sleep study: No Treatments attempted: None Wakes feeling refreshed:  yes Daytime hypersomnolence:  no Fatigue:  no Insomnia:  no Good sleep hygiene:  no Difficulty falling asleep:  no Difficulty staying asleep:  no Snoring bothers bed partner:  n/a Observed apnea by bed partner: no Obesity:  yes Hypertension: no  Pulmonary hypertension:  no Coronary artery disease:  no  History reviewed. No pertinent past medical history.  Past Surgical History:  Procedure Laterality Date  . ANKLE SURGERY    . BACK SURGERY      Family History  Problem Relation Age of Onset  . Healthy Mother   .  Healthy Father     Social History   Socioeconomic History  . Marital status: Single    Spouse name: Not on file  . Number of children: Not on file  . Years of education: Not on file  . Highest education level: Not on file  Occupational History  . Not on file  Tobacco Use  . Smoking status: Never Smoker  . Smokeless tobacco: Never Used  Vaping Use  . Vaping Use: Every day  Substance and Sexual Activity  . Alcohol use: Not Currently  . Drug use: Not Currently  . Sexual activity: Not on file  Other Topics Concern  . Not on file  Social History Narrative  . Not on file   Social Determinants of Health   Financial Resource Strain: Not on file  Food Insecurity: Not on file  Transportation Needs: Not on file  Physical Activity: Not on file  Stress: Not on file  Social Connections: Not on file  Intimate Partner Violence: Not on file    ROS Review of Systems  Constitutional: Negative.   HENT: Negative.   Eyes: Negative.   Respiratory: Negative.   Cardiovascular: Negative.   Gastrointestinal: Negative.   Endocrine: Negative.   Genitourinary: Negative.   Musculoskeletal: Negative.   Skin: Negative.   Allergic/Immunologic: Positive for environmental allergies. Negative for food allergies.  Neurological: Negative.   Psychiatric/Behavioral: Negative.     Objective:   Today's Vitals: BP 134/85   Pulse  89   Temp 98.3 F (36.8 C) (Oral)   Ht 6' 1.5" (1.867 m)   Wt (!) 331 lb 12.8 oz (150.5 kg)   SpO2 97%   BMI 43.18 kg/m   Physical Exam  Assessment & Plan:   Problem List Items Addressed This Visit      Other   Morbid obesity ([place])    BMI 43.18 today. Discussed healthy diet and exercise. He states he knows what to do, he just has to "get his head right" about it. Goal would be to lose 1-2 pounds per week.       Relevant Orders   Ambulatory referral to Sleep Studies   Snoring - Primary    Will order home sleep study and follow up based on results.       Relevant Orders   Ambulatory referral to Sleep Studies   Elevated blood-pressure reading, without diagnosis of hypertension    Blood pressure 134/85 today. Discussed healthy diet, exercise, and weight loss. He can monitor his blood pressure at home a few times a week and call if blood pressure remains >140/90. Follow-up at physical next month.        Other Visit Diagnoses    Encounter to establish care          No outpatient encounter medications on file as of 11/29/2020.   No facility-administered encounter medications on file as of 11/29/2020.    Follow-up: Return in about 4 weeks (around 12/27/2020) for physical.   Charyl Dancer, NP

## 2020-12-18 ENCOUNTER — Ambulatory Visit: Payer: BC Managed Care – PPO | Attending: Otolaryngology

## 2020-12-18 DIAGNOSIS — R0683 Snoring: Secondary | ICD-10-CM | POA: Diagnosis not present

## 2020-12-18 DIAGNOSIS — G4730 Sleep apnea, unspecified: Secondary | ICD-10-CM | POA: Diagnosis not present

## 2021-01-03 ENCOUNTER — Other Ambulatory Visit: Payer: Self-pay

## 2021-01-03 ENCOUNTER — Encounter: Payer: Self-pay | Admitting: Nurse Practitioner

## 2021-01-03 ENCOUNTER — Ambulatory Visit (INDEPENDENT_AMBULATORY_CARE_PROVIDER_SITE_OTHER): Payer: BC Managed Care – PPO | Admitting: Nurse Practitioner

## 2021-01-03 VITALS — BP 136/88 | HR 83 | Temp 98.4°F | Ht 73.5 in | Wt 337.2 lb

## 2021-01-03 DIAGNOSIS — Z13220 Encounter for screening for lipoid disorders: Secondary | ICD-10-CM

## 2021-01-03 DIAGNOSIS — Z0000 Encounter for general adult medical examination without abnormal findings: Secondary | ICD-10-CM

## 2021-01-03 DIAGNOSIS — Z23 Encounter for immunization: Secondary | ICD-10-CM | POA: Diagnosis not present

## 2021-01-03 DIAGNOSIS — Z136 Encounter for screening for cardiovascular disorders: Secondary | ICD-10-CM

## 2021-01-03 DIAGNOSIS — R0683 Snoring: Secondary | ICD-10-CM

## 2021-01-03 MED ORDER — BUPROPION HCL ER (SR) 150 MG PO TB12: 150.0000 mg | ORAL_TABLET | Freq: Two times a day (BID) | ORAL | 1 refills | Status: DC

## 2021-01-03 NOTE — Progress Notes (Signed)
BP 136/88 (BP Location: Right Arm, Cuff Size: Large)   Pulse 83   Temp 98.4 F (36.9 C) (Oral)   Ht 6' 1.5" (1.867 m)   Wt (!) 337 lb 3.2 oz (153 kg)   SpO2 97%   BMI 43.88 kg/m    Subjective:    Patient ID: Todd Conrad., male    DOB: Dec 27, 1975, 45 y.o.   MRN: [id number]  Chief Complaint  Patient presents with  . Annual Exam    Pt states he would like to discuss his sleep study results     HPI: Todd Conrad. is a 45 y.o. male presenting on 01/03/2021 for comprehensive medical examination. Current medical complaints include:none  He currently lives with: fiance and son Interim Problems from his last visit: no  Depression Screen done today and results listed below:  Depression screen [facility] 2/9 01/03/2021 11/29/2020  Decreased Interest 0 0  Down, Depressed, Hopeless 0 0  PHQ - 2 Score 0 0    The patient does not have a history of falls. I did not complete a risk assessment for falls. A plan of care for falls was not documented.   Past Medical History:  History reviewed. No pertinent past medical history.  Surgical History:  Past Surgical History:  Procedure Laterality Date  . ANKLE SURGERY    . BACK SURGERY      Medications:  No current outpatient medications on file prior to visit.   No current facility-administered medications on file prior to visit.    Allergies:  Allergies  Allergen Reactions  . Morphine     Other reaction(s): Vomiting  . Penicillins Rash    Social History:  Social History   Socioeconomic History  . Marital status: Single    Spouse name: Not on file  . Number of children: Not on file  . Years of education: Not on file  . Highest education level: Not on file  Occupational History  . Not on file  Tobacco Use  . Smoking status: Never Smoker  . Smokeless tobacco: Never Used  Vaping Use  . Vaping Use: Every day  Substance and Sexual Activity  . Alcohol use: Not Currently  . Drug use: Not Currently  . Sexual  activity: Yes  Other Topics Concern  . Not on file  Social History Narrative  . Not on file   Social Determinants of Health   Financial Resource Strain: Not on file  Food Insecurity: Not on file  Transportation Needs: Not on file  Physical Activity: Not on file  Stress: Not on file  Social Connections: Not on file  Intimate Partner Violence: Not on file   Social History   Tobacco Use  Smoking Status Never Smoker  Smokeless Tobacco Never Used   Social History   Substance and Sexual Activity  Alcohol Use Not Currently    Family History:  Family History  Problem Relation Age of Onset  . Healthy Mother   . Healthy Father     Past medical history, surgical history, medications, allergies, family history and social history reviewed with patient today and changes made to appropriate areas of the chart.   Review of Systems  Constitutional: Negative.   HENT: Negative.   Eyes: Negative.   Respiratory: Negative.   Cardiovascular: Negative.   Gastrointestinal: Negative.   Genitourinary: Negative.   Musculoskeletal: Negative.   Skin: Negative.   Neurological: Negative.   Endo/Heme/Allergies: Negative.   Psychiatric/Behavioral: Negative.    All other  ROS negative except what is listed above and in the HPI.      Objective:    BP 136/88 (BP Location: Right Arm, Cuff Size: Large)   Pulse 83   Temp 98.4 F (36.9 C) (Oral)   Ht 6' 1.5" (1.867 m)   Wt (!) 337 lb 3.2 oz (153 kg)   SpO2 97%   BMI 43.88 kg/m   Wt Readings from Last 3 Encounters:  01/03/21 (!) 337 lb 3.2 oz (153 kg)  11/29/20 (!) 331 lb 12.8 oz (150.5 kg)  09/16/20 300 lb (136.1 kg)    Physical Exam Vitals and nursing note reviewed.  Constitutional:      Appearance: Normal appearance. He is obese.  HENT:     Head: Normocephalic and atraumatic.     Right Ear: Tympanic membrane, ear canal and external ear normal.     Left Ear: Tympanic membrane, ear canal and external ear normal.     Nose: Nose  normal.     Mouth/Throat:     Mouth: Mucous membranes are moist.     Pharynx: Oropharynx is clear.  Eyes:     Conjunctiva/sclera: Conjunctivae normal.  Cardiovascular:     Rate and Rhythm: Normal rate and regular rhythm.     Pulses: Normal pulses.     Heart sounds: Normal heart sounds.  Pulmonary:     Effort: Pulmonary effort is normal.     Breath sounds: Normal breath sounds.  Abdominal:     General: Bowel sounds are normal.     Palpations: Abdomen is soft.     Tenderness: There is no abdominal tenderness.  Musculoskeletal:        General: Normal range of motion.     Cervical back: Normal range of motion and neck supple.  Lymphadenopathy:     Cervical: No cervical adenopathy.  Skin:    General: Skin is warm and dry.  Neurological:     General: No focal deficit present.     Mental Status: He is alert and oriented to person, place, and time.     Cranial Nerves: No cranial nerve deficit.     Gait: Gait normal.     Deep Tendon Reflexes: Reflexes normal.  Psychiatric:        Mood and Affect: Mood normal.        Behavior: Behavior normal.        Thought Content: Thought content normal.        Judgment: Judgment normal.     Results for orders placed or performed during the hospital encounter of 09/16/20  SARS CORONAVIRUS 2 (TAT 6-24 HRS) Nasopharyngeal Nasopharyngeal Swab   Specimen: Nasopharyngeal Swab  Result Value Ref Range   SARS Coronavirus 2 POSITIVE (A) NEGATIVE      Assessment & Plan:   Problem List Items Addressed This Visit      Other   Morbid obesity ([place])    Has been trying to watch his diet. 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 Discussed diet and exercise along with pharmacological therapy. He is not interested in any injections. Will start wellbutrin to help with weight loss. He tolerated this medication in the past when he was quitting smoking. Will follow-up in 1 month.       Snoring    Called the  company that did the home sleep study. Awaiting results to be faxed over.        Other Visit  Diagnoses    Routine general medical examination at a health care facility    -  Primary   Health maintenance reviewed and updated. Check CMP, CBC, TSH today. He declined hepatitis C and HIV test today   Relevant Orders   Comp Met (CMET)   CBC   TSH   Encounter for lipid screening for cardiovascular disease       Check lipid panel today   Relevant Orders   Lipid Panel w/o Chol/HDL Ratio   Need for Tdap vaccination       Relevant Orders   Tdap vaccine greater than or equal to 7yo IM (Completed)       Discussed aspirin prophylaxis for myocardial infarction prevention and decision was it was not indicated  LABORATORY TESTING:  Health maintenance labs ordered today as discussed above.   The natural history of prostate cancer and ongoing controversy regarding screening and potential treatment outcomes of prostate cancer has been discussed with the patient. The meaning of a false positive PSA and a false negative PSA has been discussed. He indicates understanding of the limitations of this screening test and wishes not to proceed with screening PSA testing as only 45 years old today.   IMMUNIZATIONS:   - Tdap: Tetanus vaccination status reviewed: Td vaccination indicated and given today. - Influenza: Postponed to flu season - Pneumovax: Not applicable - Prevnar: Not applicable - HPV: Not applicable - Zostavax vaccine: Not applicable  SCREENING: - Colonoscopy: Not applicable  Discussed with patient purpose of the colonoscopy is to detect colon cancer at curable precancerous or early stages   - AAA Screening: Not applicable  -Hearing Test: Not applicable  -Spirometry: Not applicable   PATIENT COUNSELING:    Sexuality: Discussed sexually transmitted diseases, partner selection, use of condoms, avoidance of unintended pregnancy  and contraceptive alternatives.   Advised to avoid  cigarette smoking.  I discussed with the patient that most people either abstain from alcohol or drink within safe limits (<=14/week and <=4 drinks/occasion for males, <=7/weeks and <= 3 drinks/occasion for females) and that the risk for alcohol disorders and other health effects rises proportionally with the number of drinks per week and how often a drinker exceeds daily limits.  Discussed cessation/primary prevention of drug use and availability of treatment for abuse.   Diet: Encouraged to adjust caloric intake to maintain  or achieve ideal body weight, to reduce intake of dietary saturated fat and total fat, to limit sodium intake by avoiding high sodium foods and not adding table salt, and to maintain adequate dietary potassium and calcium preferably from fresh fruits, vegetables, and low-fat dairy products.    stressed the importance of regular exercise  Injury prevention: Discussed safety belts, safety helmets, smoke detector, smoking near bedding or upholstery.   Dental health: Discussed importance of regular tooth brushing, flossing, and dental visits.   Follow up plan: NEXT PREVENTATIVE PHYSICAL DUE IN 1 YEAR. Return in about 1 month (around 02/03/2021) for weight.

## 2021-01-03 NOTE — Assessment & Plan Note (Signed)
Has been trying to watch his diet. 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 Discussed diet and exercise along with pharmacological therapy. He is not interested in any injections. Will start wellbutrin to help with weight loss. He tolerated this medication in the past when he was quitting smoking. Will follow-up in 1 month.

## 2021-01-03 NOTE — Patient Instructions (Signed)

## 2021-01-03 NOTE — Assessment & Plan Note (Signed)
Called the company that did the home sleep study. Awaiting results to be faxed over.

## 2021-01-04 ENCOUNTER — Telehealth: Payer: Self-pay | Admitting: Nurse Practitioner

## 2021-01-04 LAB — LIPID PANEL W/O CHOL/HDL RATIO
Cholesterol, Total: 200 mg/dL — ABNORMAL HIGH (ref 100–199)
HDL: 41 mg/dL (ref >39)
LDL Chol Calc (NIH): 99 mg/dL (ref 0–99)
Triglycerides: 357 mg/dL — ABNORMAL HIGH (ref 0–149)
VLDL Cholesterol Cal: 60 mg/dL — ABNORMAL HIGH (ref 5–40)

## 2021-01-04 LAB — COMPREHENSIVE METABOLIC PANEL
ALT: 28 IU/L (ref 0–44)
AST: 26 IU/L (ref 0–40)
Albumin/Globulin Ratio: 1.5 (ref 1.2–2.2)
Albumin: 4.3 g/dL (ref 4.0–5.0)
Alkaline Phosphatase: 78 IU/L (ref 44–121)
BUN/Creatinine Ratio: 15 (ref 9–20)
BUN: 17 mg/dL (ref 6–24)
Bilirubin Total: 0.3 mg/dL (ref 0.0–1.2)
CO2: 21 mmol/L (ref 20–29)
Calcium: 8.9 mg/dL (ref 8.7–10.2)
Chloride: 105 mmol/L (ref 96–106)
Creatinine, Ser: 1.13 mg/dL (ref 0.76–1.27)
Globulin, Total: 2.8 g/dL (ref 1.5–4.5)
Glucose: 102 mg/dL — ABNORMAL HIGH (ref 65–99)
Potassium: 4.1 mmol/L (ref 3.5–5.2)
Sodium: 142 mmol/L (ref 134–144)
Total Protein: 7.1 g/dL (ref 6.0–8.5)
eGFR: 83 mL/min/{1.73_m2} (ref >59)

## 2021-01-04 LAB — CBC
Hematocrit: 45.5 % (ref 37.5–51.0)
Hemoglobin: 14.8 g/dL (ref 13.0–17.7)
MCH: 28.1 pg (ref 26.6–33.0)
MCHC: 32.5 g/dL (ref 31.5–35.7)
MCV: 86 fL (ref 79–97)
Platelets: 259 10*3/uL (ref 150–450)
RBC: 5.27 x10E6/uL (ref 4.14–5.80)
RDW: 13.6 % (ref 11.6–15.4)
WBC: 8.3 10*3/uL (ref 3.4–10.8)

## 2021-01-04 LAB — TSH: TSH: 1.04 u[IU]/mL (ref 0.450–4.500)

## 2021-01-04 NOTE — Telephone Encounter (Signed)
Lauren did you get these results yesterday?

## 2021-01-04 NOTE — Telephone Encounter (Signed)
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 Pt does not know the company perform the test. Pt states our office ordered the sleep study

## 2021-01-05 NOTE — Telephone Encounter (Signed)
Pt is very upset as states will not have a job if does not get results back, wants the number to track down results himself, pt just upset, fu # is [phone]

## 2021-01-05 NOTE — Telephone Encounter (Signed)
Called and LVM with Ladona Ridgel at [facility] that we did not receive that patient's results that she was faxing on Monday. Asked for her to please send the results over to [place] ASAP. Will try to call her again here shortly.

## 2021-01-05 NOTE — Telephone Encounter (Signed)
Patient is calling  back checking on status of his sleep study results. Spoke to with [facility] and they advised the turn around is 12 days from time sleep study was done., patient informed. Patient states he needs this by June 8 for his employer. (patient states its been 2 weeks already)

## 2021-01-06 NOTE — Telephone Encounter (Signed)
Received a call back from [place] at [facility]. She stated that there was a mix up with this patient's report and they had to track down his study report. Dr. Willeen Cass now has the study results and as soon as he reads them, they will be sending them over.

## 2021-01-06 NOTE — Telephone Encounter (Signed)
Called and left Ladona Ridgel another VM at [facility] trying to track down results.

## 2021-01-07 NOTE — Telephone Encounter (Signed)
Patient notified of this via Mychart message.

## 2021-01-14 NOTE — Telephone Encounter (Signed)
Results received this morning. Routing to provider to review. Results given to provider.

## 2021-01-14 NOTE — Telephone Encounter (Signed)
Normal sleep study. No need for CPAP

## 2021-01-14 NOTE — Telephone Encounter (Signed)
Patient notified of results. Copy of results placed up front for him to pick up.

## 2021-02-07 ENCOUNTER — Ambulatory Visit: Payer: BC Managed Care – PPO | Admitting: Nurse Practitioner

## 2021-02-20 ENCOUNTER — Encounter: Payer: Self-pay | Admitting: Emergency Medicine

## 2021-02-20 ENCOUNTER — Other Ambulatory Visit: Payer: Self-pay

## 2021-02-20 ENCOUNTER — Ambulatory Visit
Admission: EM | Admit: 2021-02-20 | Discharge: 2021-02-20 | Disposition: A | Discharge status: Home / Self Care | Payer: BC Managed Care – PPO

## 2021-02-20 ENCOUNTER — Emergency Department
Admission: EM | Admit: 2021-02-20 | Discharge: 2021-02-20 | Disposition: A | Discharge status: Home / Self Care | Payer: BC Managed Care – PPO | Attending: Emergency Medicine | Admitting: Emergency Medicine

## 2021-02-20 DIAGNOSIS — S6991XA Unspecified injury of right wrist, hand and finger(s), initial encounter: Secondary | ICD-10-CM | POA: Diagnosis not present

## 2021-02-20 DIAGNOSIS — S61411A Laceration without foreign body of right hand, initial encounter: Secondary | ICD-10-CM | POA: Insufficient documentation

## 2021-02-20 DIAGNOSIS — S61011A Laceration without foreign body of right thumb without damage to nail, initial encounter: Secondary | ICD-10-CM

## 2021-02-20 DIAGNOSIS — W268XXA Contact with other sharp object(s), not elsewhere classified, initial encounter: Secondary | ICD-10-CM | POA: Insufficient documentation

## 2021-02-20 MED ORDER — LIDOCAINE HCL (PF) 1 % IJ SOLN
10.0000 mL | Freq: Once | INTRAMUSCULAR | Status: AC
  Administered 2021-02-20: 10 mL
  Filled 2021-02-20: qty 10

## 2021-02-20 NOTE — ED Triage Notes (Signed)
Pt sliced his a laceration on the outside of his thumb on his right hand he cut it with a mandalin. He is able to move fingers and and has sensation in his hand and fingertips Bleeding is under control at this time.

## 2021-02-20 NOTE — ED Notes (Signed)
Patient is being discharged from the Urgent Care and sent to the Emergency Department via private vehicle . Per Beatrix Fetters, PA, patient is in need of higher level of care due to his lacertaion. Patient is aware and verbalizes understanding of plan of care.  Vitals:   02/20/21 1435  BP: (!) 145/79  Pulse: 73  Resp: 18  Temp: 98.1 F (36.7 C)  SpO2: 100%

## 2021-02-20 NOTE — ED Notes (Signed)
Pt calm , collective, ambulatory upon discharge 

## 2021-02-20 NOTE — Discharge Instructions (Addendum)
Please keep bulky dressing on the right hand clean and dry x2 days.  In 2 days you can remove dressing, begin showering and keep laceration site covered during the day with a dressing.  Follow-up with primary care provider, urgent care or walk-in clinic in 10 to 12 days for suture removal.  Return to the ER for any swelling warmth redness or any urgent changes in your health

## 2021-02-20 NOTE — ED Provider Notes (Signed)
[facility] EM[facility]ENC[facility] Provider Note   CSN: [id number] Arrival date & time: 02/20/21  1533     History Chief Complaint  Patient presents with   Laceration    Todd Conrad. is a 45 y.o. male presents to the emergency department for evaluation of laceration to the right thumb.  Patient suffered laceration with a mandolin just prior to arrival, patient was cutting squash.  Patient has a linear laceration from the thenar eminence up to the interphalangeal joint.  Bleeding has been present.  He denies any limitations in range of motion, numbness, tingling.  His tetanus is up-to-date.  HPI     No past medical history on file.  Patient Active Problem List   Diagnosis Date Noted   Morbid obesity ([facility]) 11/29/2020   Snoring 11/29/2020   Elevated blood-pressure reading, without diagnosis of hypertension 11/29/2020    Past Surgical History:  Procedure Laterality Date   ANKLE SURGERY     BACK SURGERY         Family History  Problem Relation Age of Onset   Healthy Mother    Healthy Father     Social History   Tobacco Use   Smoking status: Never   Smokeless tobacco: Never  Vaping Use   Vaping Use: Every day  Substance Use Topics   Alcohol use: Not Currently   Drug use: Not Currently    Home Medications Prior to Admission medications   Not on File    Allergies    Morphine and Penicillins  Review of Systems   Review of Systems  Musculoskeletal:  Negative for arthralgias and myalgias.  Skin:  Positive for wound. Negative for color change.  Neurological:  Negative for numbness.   Physical Exam Updated Vital Signs BP (!) 162/101 (BP Location: Left Arm)   Pulse 71   Temp 98.2 F (36.8 C) (Oral)   Resp 18   Ht 6\' 2"  (1.88 m)   Wt (!) 154.2 kg   SpO2 96%   BMI 43.65 kg/m   Physical Exam Constitutional:      Appearance: He is well-developed.  HENT:     Head: Normocephalic and atraumatic.  Eyes:      Conjunctiva/sclera: Conjunctivae normal.  Cardiovascular:     Rate and Rhythm: Normal rate.  Pulmonary:     Effort: Pulmonary effort is normal. No respiratory distress.  Musculoskeletal:        General: Normal range of motion.     Cervical back: Normal range of motion.     Comments: Right hand with full range of motion of the thumb and digits.  6 cm linear laceration from the base of the thenar eminence up into the interphalangeal joint, laceration is superficial and not gaping.  Bleeding well controlled.  Sensation is intact distally.  No visible palpable foreign body  Skin:    General: Skin is warm.     Findings: No rash.  Neurological:     Mental Status: He is alert and oriented to person, place, and time.  Psychiatric:        Behavior: Behavior normal.        Thought Content: Thought content normal.    ED Results / Procedures / Treatments   Labs (all labs ordered are listed, but only abnormal results are displayed) Labs Reviewed - No data to display  EKG None  Radiology No results found.  Procedures . Laceration Repair  Date/Time: 02/20/2021 5:35 PM Performed by: 04/23/2021, PA-C Authorized  by: Evon Slack, PA-C   Consent:    Consent obtained:  Verbal   Consent given by:  Patient Universal protocol:    Patient identity confirmed:  Verbally with patient Anesthesia:    Anesthesia method:  Local infiltration   Local anesthetic:  Lidocaine 1% w/o epi Laceration details:    Location:  Hand   Hand location:  R palm   Length (cm):  6   Depth (mm):  2 Pre-procedure details:    Preparation:  Patient was prepped and draped in usual sterile fashion Exploration:    Wound exploration: wound explored through full range of motion and entire depth of wound visualized   Treatment:    Area cleansed with:  Povidone-iodine and saline   Amount of cleaning:  Standard   Irrigation method:  Pressure wash   Visualized foreign bodies/material removed: no   Skin repair:     Repair method:  Sutures   Suture size:  5-0   Suture material:  Nylon   Suture technique:  Simple interrupted   Number of sutures:  10 Approximation:    Approximation:  Close Repair type:    Repair type:  Simple Post-procedure details:    Dressing:  Bulky dressing and non-adherent dressing   Procedure completion:  Tolerated well, no immediate complications   Medications Ordered in ED Medications  lidocaine (PF) (XYLOCAINE) 1 % injection 10 mL (has no administration in time range)    ED Course  I have reviewed the triage vital signs and the nursing notes.  Pertinent labs & imaging results that were available during my care of the patient were reviewed by me and considered in my medical decision making (see chart for details).    MDM Rules/Calculators/A&P                          45 year old male with a right hand laceration.  Tetanus up-to-date.  Laceration clean and superficial, linear with no signs of tendon injury.  Laceration thoroughly irrigated, no visible palpable foreign body.  Laceration successfully repaired with ten 5-0 nylon sutures.  Bulky dressing applied he is educated on wound care and signs symptoms return to the ER for. Final Clinical Impression(s) / ED Diagnoses Final diagnoses:  Laceration of right hand without foreign body, initial encounter    Rx / DC Orders ED Discharge Orders     None        Ronnette Juniper 02/20/21 1737    Arnaldo Natal, MD 02/20/21 [phone]

## 2021-02-20 NOTE — ED Triage Notes (Signed)
Pt cut his right palm on a mandolin about an hour ago. He has a laceration on the thumb side of the palm of his hand. Pt states he had tetanus about 3-4 weeks ago.

## 2021-02-20 NOTE — ED Provider Notes (Signed)
[facility]    CSN: [id number] Arrival date & time: 02/20/21  1401      History   Chief Complaint Chief Complaint  Patient presents with   Laceration    Right palm    HPI Todd Conrad. is a 45 y.o. male who cut his L medial R thumb on a mandolin about 1h ago. Last TD 3-4 weeks ago.     History reviewed. No pertinent past medical history.  Patient Active Problem List   Diagnosis Date Noted   Morbid obesity (HCC) 11/29/2020   Snoring 11/29/2020   Elevated blood-pressure reading, without diagnosis of hypertension 11/29/2020    Past Surgical History:  Procedure Laterality Date   ANKLE SURGERY     BACK SURGERY         Home Medications    Prior to Admission medications   Not on File    Family History Family History  Problem Relation Age of Onset   Healthy Mother    Healthy Father     Social History Social History   Tobacco Use   Smoking status: Never   Smokeless tobacco: Never  Vaping Use   Vaping Use: Every day  Substance Use Topics   Alcohol use: Not Currently   Drug use: Not Currently     Allergies   Morphine and Penicillins   Review of Systems Review of Systems  Skin:  Positive for wound.    Physical Exam Triage Vital Signs ED Triage Vitals  Enc Vitals Group     BP 02/20/21 1435 (!) 145/79     Pulse Rate 02/20/21 1435 73     Resp 02/20/21 1435 18     Temp 02/20/21 1435 98.1 F (36.7 C)     Temp Source 02/20/21 1435 Oral     SpO2 02/20/21 1435 100 %     Weight 02/20/21 1434 (!) 340 lb (154.2 kg)     Height 02/20/21 1434 6\' 2"  (1.88 m)     Head Circumference --      Peak Flow --      Pain Score 02/20/21 1433 7     Pain Loc --      Pain Edu? --      Excl. in GC? --    No data found.  Updated Vital Signs BP (!) 145/79 (BP Location: Left Arm)   Pulse 73   Temp 98.1 F (36.7 C) (Oral)   Resp 18   Ht 6\' 2"  (1.88 m)   Wt (!) 340 lb (154.2 kg)   SpO2 100%   BMI 43.65 kg/m   Visual Acuity Right Eye  Distance:   Left Eye Distance:   Bilateral Distance:    Right Eye Near:   Left Eye Near:    Bilateral Near:     Physical Exam Vitals reviewed.  Constitutional:      Appearance: He is obese.     Comments: He had pressure dressing and covan and had his hand above his head when I entered the room  HENT:     Right Ear: External ear normal.     Left Ear: External ear normal.  Eyes:     Conjunctiva/sclera: Conjunctivae normal.  Pulmonary:     Effort: Pulmonary effort is normal.  Musculoskeletal:     Cervical back: Neck supple.     Comments: R HAND- has about 6 cm laceration on lateral palmar thumb which after pressure dressing and elevation had a lot of bleeding and the gauze  was already getting soaked. Has mild flexion of his thumb. The cut looks deep.   Skin:    General: Skin is warm and dry.     Capillary Refill: Capillary refill takes less than 2 seconds.  Neurological:     Mental Status: He is alert and oriented to person, place, and time.  Psychiatric:        Mood and Affect: Mood normal.        Behavior: Behavior normal.        Thought Content: Thought content normal.        Judgment: Judgment normal.   UC Treatments / Results  Labs (all labs ordered are listed, but only abnormal results are displayed) Labs Reviewed - No data to display  EKG   Radiology No results found.  Procedures Procedures (including critical care time)  Medications Ordered in UC Medications - No data to display  Initial Impression / Assessment and Plan / UC Course  I have reviewed the triage vital signs and the nursing notes. I sent him to ER for care since the cut is bleeding so much even after pressure dressing and elevation.  Final Clinical Impressions(s) / UC Diagnoses   Final diagnoses:  Thumb laceration, right, initial encounter   Discharge Instructions   None    ED Prescriptions   None    PDMP not reviewed this encounter.   Garey Ham, PA-C 02/20/21  1456

## 2021-03-01 ENCOUNTER — Ambulatory Visit: Payer: BC Managed Care – PPO | Admitting: Nurse Practitioner

## 2021-03-18 ENCOUNTER — Other Ambulatory Visit: Payer: Self-pay

## 2022-01-30 ENCOUNTER — Ambulatory Visit: Payer: BC Managed Care – PPO | Admitting: Nurse Practitioner

## 2022-07-03 ENCOUNTER — Encounter: Payer: Self-pay | Admitting: Family Medicine

## 2022-08-08 ENCOUNTER — Ambulatory Visit: Payer: BC Managed Care – PPO | Admitting: Family Medicine

## 2022-08-11 ENCOUNTER — Ambulatory Visit: Payer: BC Managed Care – PPO | Admitting: Family Medicine

## 2023-11-26 ENCOUNTER — Ambulatory Visit: Admitting: Family Medicine

## 2023-11-26 ENCOUNTER — Encounter: Payer: Self-pay | Admitting: Family Medicine

## 2023-11-26 VITALS — BP 133/72 | HR 79 | Ht 74.0 in | Wt 335.8 lb

## 2023-11-26 DIAGNOSIS — D1721 Benign lipomatous neoplasm of skin and subcutaneous tissue of right arm: Secondary | ICD-10-CM

## 2023-11-26 DIAGNOSIS — Z1211 Encounter for screening for malignant neoplasm of colon: Secondary | ICD-10-CM | POA: Diagnosis not present

## 2023-11-26 DIAGNOSIS — Z0000 Encounter for general adult medical examination without abnormal findings: Secondary | ICD-10-CM

## 2023-11-26 DIAGNOSIS — R600 Localized edema: Secondary | ICD-10-CM | POA: Diagnosis not present

## 2023-11-26 LAB — BAYER DCA HB A1C WAIVED: HB A1C (BAYER DCA - WAIVED): 5.5 % (ref 4.8–5.6)

## 2023-11-26 NOTE — Patient Instructions (Signed)
 Check on medicines- Zepbound and Wegovy to see if they're covered- if they are I'm happy to send them in for you.

## 2023-11-26 NOTE — Assessment & Plan Note (Signed)
 Discussed zepbound and wegovy. Discussed that they require close follow up if we start them. He can check with his insurance- if they're covered we will send them in, but he will need to have follow up. Offered referral to nutrition- declined at this time. Will check labs to look for other causes. Call with any concerns.

## 2023-11-26 NOTE — Progress Notes (Signed)
 BP 133/72 (BP Location: Right Arm, Patient Position: Sitting, Cuff Size: Large)   Pulse 79   Ht 6\' 2"  (1.88 m)   Wt (!) 335 lb 12.8 oz (152.3 kg)   SpO2 96%   BMI 43.11 kg/m    Subjective:    Patient ID: Todd Conrad., male    DOB: 17-Feb-1976, 48 y.o.   MRN: [id number]  HPI: Todd Conrad. is a 48 y.o. male presenting on 11/26/2023 to re-establish care after being lost to follow up when his previous provider left about 3 years ago. He is also here for comprehensive medical examination. Current medical complaints include:  OBESITY- "can't lose weight" Generally eating "meat and vegetables" Duration: chronic Previous attempts at weight loss: has been lifting weights, has been trying to walk, has been calorie counting- he thinks he's been eating about 1800 calories a day, also drinks calories Complications of obesity: none Peak weight: 340lbs Weight loss goal: to be healthy Weight loss to date: 5lbs Requesting obesity pharmacotherapy: yes Current weight loss supplements/medications: no Previous weight loss supplements/meds: no  LUMP Duration: a couple of years- hasn't changed in about a year Location: on his back Onset: gradual Painful: no Discomfort: no Status:  not changing Trauma: no Redness: no Bruising: no Recent infection: no Swollen lymph nodes: no Requesting removal: yes History of cancer: no Family history of cancer: no History of the same: no  Interim Problems from his last visit: no  Depression Screen done today and results listed below:     11/26/2023    3:47 PM 01/03/2021    1:21 PM 11/29/2020    1:01 PM  Depression screen PHQ 2/9  Decreased Interest 3 0 0  Down, Depressed, Hopeless 0 0 0  PHQ - 2 Score 3 0 0  Altered sleeping 2    Tired, decreased energy 2    Change in appetite 3    Feeling bad or failure about yourself  0    Trouble concentrating 2    Moving slowly or fidgety/restless 0    Suicidal thoughts 0    PHQ-9 Score 12     Difficult doing work/chores Not difficult at all      Past Medical History:  History reviewed. No pertinent past medical history.  Surgical History:  Past Surgical History:  Procedure Laterality Date   ANKLE SURGERY     BACK SURGERY      Medications:  No current outpatient medications on file prior to visit.   No current facility-administered medications on file prior to visit.    Allergies:  Allergies  Allergen Reactions   Morphine     Other reaction(s): Vomiting   Penicillins Rash    Social History:  Social History   Socioeconomic History   Marital status: Single    Spouse name: Not on file   Number of children: Not on file   Years of education: Not on file   Highest education level: 12th grade  Occupational History   Not on file  Tobacco Use   Smoking status: Never   Smokeless tobacco: Never  Vaping Use   Vaping status: Every Day  Substance and Sexual Activity   Alcohol use: Not Currently   Drug use: Not Currently   Sexual activity: Yes  Other Topics Concern   Not on file  Social History Narrative   Not on file   Social Drivers of Health   Financial Resource Strain: Medium Risk (11/25/2023)   Overall Financial Resource Strain (  CARDIA)    Difficulty of Paying Living Expenses: Somewhat hard  Food Insecurity: No Food Insecurity (11/25/2023)   Hunger Vital Sign    Worried About Running Out of Food in the Last Year: Never true    Ran Out of Food in the Last Year: Never true  Transportation Needs: No Transportation Needs (11/25/2023)   PRAPARE - Administrator, Civil Service (Medical): No    Lack of Transportation (Non-Medical): No  Physical Activity: Sufficiently Active (11/25/2023)   Exercise Vital Sign    Days of Exercise per Week: 5 days    Minutes of Exercise per Session: 40 min  Stress: No Stress Concern Present (11/25/2023)   [employer] of Occupational Health - Occupational Stress Questionnaire    Feeling of Stress : Not at all   Social Connections: Unknown (11/25/2023)   Social Connection and Isolation Panel [NHANES]    Frequency of Communication with Friends and Family: Once a week    Frequency of Social Gatherings with Friends and Family: Patient declined    Attends Religious Services: Patient declined    Database administrator or Organizations: No    Attends Engineer, structural: Not on file    Marital Status: Married  Intimate Partner Violence: Unknown (08/21/2022)   Received from [facility]   HITS    Physically Hurt: Not on file    Insult or Talk Down To: Not on file    Threaten Physical Harm: Not on file    Scream or Curse: Not on file   Social History   Tobacco Use  Smoking Status Never  Smokeless Tobacco Never   Social History   Substance and Sexual Activity  Alcohol Use Not Currently    Family History:  Family History  Problem Relation Age of Onset   Healthy Mother    Healthy Father     Past medical history, surgical history, medications, allergies, family history and social history reviewed with patient today and changes made to appropriate areas of the chart.   Review of Systems  Constitutional: Negative.   HENT: Negative.    Eyes:  Positive for blurred vision. Negative for double vision, photophobia, pain, discharge and redness.  Respiratory: Negative.    Cardiovascular:  Positive for leg swelling (with driving long distances). Negative for chest pain, palpitations, orthopnea, claudication and PND.  Gastrointestinal: Negative.   Genitourinary: Negative.   Musculoskeletal: Negative.   Skin: Negative.        +chaffing  Neurological: Negative.   Endo/Heme/Allergies: Negative.   Psychiatric/Behavioral: Negative.     All other ROS negative except what is listed above and in the HPI.      Objective:    BP 133/72 (BP Location: Right Arm, Patient Position: Sitting, Cuff Size: Large)   Pulse 79   Ht 6\' 2"  (1.88 m)   Wt (!) 335 lb 12.8 oz (152.3 kg)   SpO2 96%   BMI  43.11 kg/m   Wt Readings from Last 3 Encounters:  11/26/23 (!) 335 lb 12.8 oz (152.3 kg)  02/20/21 (!) 340 lb (154.2 kg)  02/20/21 (!) 340 lb (154.2 kg)    Physical Exam Vitals and nursing note reviewed.  Constitutional:      General: He is not in acute distress.    Appearance: Normal appearance. He is obese. He is not ill-appearing, toxic-appearing or diaphoretic.  HENT:     Head: Normocephalic and atraumatic.     Right Ear: Tympanic membrane, ear canal and external ear  normal. There is no impacted cerumen.     Left Ear: Tympanic membrane, ear canal and external ear normal. There is no impacted cerumen.     Nose: Nose normal. No congestion or rhinorrhea.     Mouth/Throat:     Mouth: Mucous membranes are moist.     Pharynx: Oropharynx is clear. No oropharyngeal exudate or posterior oropharyngeal erythema.  Eyes:     General: No scleral icterus.       Right eye: No discharge.        Left eye: No discharge.     Extraocular Movements: Extraocular movements intact.     Conjunctiva/sclera: Conjunctivae normal.     Pupils: Pupils are equal, round, and reactive to light.  Neck:     Vascular: No carotid bruit.  Cardiovascular:     Rate and Rhythm: Normal rate and regular rhythm.     Pulses: Normal pulses.     Heart sounds: No murmur heard.    No friction rub. No gallop.  Pulmonary:     Effort: Pulmonary effort is normal. No respiratory distress.     Breath sounds: Normal breath sounds. No stridor. No wheezing, rhonchi or rales.  Chest:     Chest wall: No tenderness.  Abdominal:     General: Abdomen is flat. Bowel sounds are normal. There is no distension.     Palpations: Abdomen is soft. There is no mass.     Tenderness: There is no abdominal tenderness. There is no right CVA tenderness, left CVA tenderness, guarding or rebound.     Hernia: No hernia is present.  Genitourinary:    Comments: Genital exam deferred with shared decision making Musculoskeletal:        General: No  swelling, tenderness, deformity or signs of injury. Normal range of motion.     Cervical back: Normal range of motion and neck supple. No rigidity. No muscular tenderness.     Right lower leg: Edema (trace) present.     Left lower leg: Edema (trace) present.  Lymphadenopathy:     Cervical: No cervical adenopathy.  Skin:    General: Skin is warm and dry.     Capillary Refill: Capillary refill takes less than 2 seconds.     Coloration: Skin is not jaundiced or pale.     Findings: No bruising, erythema, lesion or rash.     Comments: 2 inch lipoma on R posterior shoulder  Neurological:     General: No focal deficit present.     Mental Status: He is alert and oriented to person, place, and time.     Cranial Nerves: No cranial nerve deficit.     Sensory: No sensory deficit.     Motor: No weakness.     Coordination: Coordination normal.     Gait: Gait normal.     Deep Tendon Reflexes: Reflexes normal.  Psychiatric:        Mood and Affect: Mood normal.        Behavior: Behavior normal.        Thought Content: Thought content normal.        Judgment: Judgment normal.     Results for orders placed or performed in visit on 01/03/21  Comp Met (CMET)   Collection Time: 01/03/21  1:45 PM  Result Value Ref Range   Glucose 102 (H) 65 - 99 mg/dL   BUN 17 6 - 24 mg/dL   Creatinine, Ser 4.09 0.76 - 1.27 mg/dL   eGFR 83 >81 XB/JYN/8.29  BUN/Creatinine Ratio 15 9 - 20   Sodium 142 134 - 144 mmol/L   Potassium 4.1 3.5 - 5.2 mmol/L   Chloride 105 96 - 106 mmol/L   CO2 21 20 - 29 mmol/L   Calcium 8.9 8.7 - 10.2 mg/dL   Total Protein 7.1 6.0 - 8.5 g/dL   Albumin 4.3 4.0 - 5.0 g/dL   Globulin, Total 2.8 1.5 - 4.5 g/dL   Albumin/Globulin Ratio 1.5 1.2 - 2.2   Bilirubin Total 0.3 0.0 - 1.2 mg/dL   Alkaline Phosphatase 78 44 - 121 IU/L   AST 26 0 - 40 IU/L   ALT 28 0 - 44 IU/L  CBC   Collection Time: 01/03/21  1:45 PM  Result Value Ref Range   WBC 8.3 3.4 - 10.8 x10E3/uL   RBC 5.27 4.14 -  5.80 x10E6/uL   Hemoglobin 14.8 13.0 - 17.7 g/dL   Hematocrit 40.9 81.1 - 51.0 %   MCV 86 79 - 97 fL   MCH 28.1 26.6 - 33.0 pg   MCHC 32.5 31.5 - 35.7 g/dL   RDW 91.4 78.2 - 95.6 %   Platelets 259 150 - 450 x10E3/uL  TSH   Collection Time: 01/03/21  1:45 PM  Result Value Ref Range   TSH 1.040 0.450 - 4.500 uIU/mL  Lipid Panel w/o Chol/HDL Ratio   Collection Time: 01/03/21  1:45 PM  Result Value Ref Range   Cholesterol, Total 200 (H) 100 - 199 mg/dL   Triglycerides 213 (H) 0 - 149 mg/dL   HDL 41 >08 mg/dL   VLDL Cholesterol Cal 60 (H) 5 - 40 mg/dL   LDL Chol Calc (NIH) 99 0 - 99 mg/dL      Assessment & Plan:   Problem List Items Addressed This Visit       Other   Morbid obesity (HCC)   Discussed zepbound and wegovy. Discussed that they require close follow up if we start them. He can check with his insurance- if they're covered we will send them in, but he will need to have follow up. Offered referral to nutrition- declined at this time. Will check labs to look for other causes. Call with any concerns.       Other Visit Diagnoses       Routine general medical examination at a health care facility    -  Primary   Vaccines up to date/declined. Screening labs checked today. Cologuard ordered. Continue diet and exercise. Call with any concerns.   Relevant Orders   Comprehensive metabolic panel with GFR   CBC with Differential/Platelet   Lipid Panel w/o Chol/HDL Ratio   PSA   TSH   Bayer DCA Hb A1c Waived   HIV Antibody (routine testing w rflx)   Hepatitis C Antibody     Peripheral edema       Will check BNP and start compression hose. Call with any concerns.   Relevant Orders   B Nat Peptide     Lipoma of right upper extremity       Would send to general surgery for removal. He declines at this time due to time- will call if he changes his mind.     Screening for colon cancer       Cologuard ordered today.   Relevant Orders   Cologuard       LABORATORY TESTING:   Health maintenance labs ordered today as discussed above.   The natural history of prostate cancer and ongoing controversy regarding  screening and potential treatment outcomes of prostate cancer has been discussed with the patient. The meaning of a false positive PSA and a false negative PSA has been discussed. He indicates understanding of the limitations of this screening test and wishes to proceed with screening PSA testing.   IMMUNIZATIONS:   - Tdap: Tetanus vaccination status reviewed: last tetanus booster within 10 years. - Influenza: Postponed to flu season - Pneumovax: Not applicable - Prevnar: Not applicable - COVID: Refused - HPV: Not applicable - Shingrix vaccine: Not applicable  SCREENING: - Colonoscopy: Ordered today  Discussed with patient purpose of the colonoscopy is to detect colon cancer at curable precancerous or early stages   PATIENT COUNSELING:    Sexuality: Discussed sexually transmitted diseases, partner selection, use of condoms, avoidance of unintended pregnancy  and contraceptive alternatives.   Advised to avoid cigarette smoking.  I discussed with the patient that most people either abstain from alcohol or drink within safe limits (<=14/week and <=4 drinks/occasion for males, <=7/weeks and <= 3 drinks/occasion for females) and that the risk for alcohol disorders and other health effects rises proportionally with the number of drinks per week and how often a drinker exceeds daily limits.  Discussed cessation/primary prevention of drug use and availability of treatment for abuse.   Diet: Encouraged to adjust caloric intake to maintain  or achieve ideal body weight, to reduce intake of dietary saturated fat and total fat, to limit sodium intake by avoiding high sodium foods and not adding table salt, and to maintain adequate dietary potassium and calcium preferably from fresh fruits, vegetables, and low-fat dairy products.    stressed the importance of regular  exercise  Injury prevention: Discussed safety belts, safety helmets, smoke detector, smoking near bedding or upholstery.   Dental health: Discussed importance of regular tooth brushing, flossing, and dental visits.   Follow up plan: NEXT PREVENTATIVE PHYSICAL DUE IN 1 YEAR. Return in about 1 year (around 11/25/2024) for physical.

## 2023-11-28 LAB — TSH: TSH: 1.04 u[IU]/mL (ref 0.450–4.500)

## 2023-11-28 LAB — COMPREHENSIVE METABOLIC PANEL WITH GFR
ALT: 35 IU/L (ref 0–44)
AST: 37 IU/L (ref 0–40)
Albumin: 4.2 g/dL (ref 4.1–5.1)
Alkaline Phosphatase: 73 IU/L (ref 44–121)
BUN/Creatinine Ratio: 15 (ref 9–20)
BUN: 14 mg/dL (ref 6–24)
Bilirubin Total: 0.3 mg/dL (ref 0.0–1.2)
CO2: 21 mmol/L (ref 20–29)
Calcium: 9.1 mg/dL (ref 8.7–10.2)
Chloride: 107 mmol/L — ABNORMAL HIGH (ref 96–106)
Creatinine, Ser: 0.96 mg/dL (ref 0.76–1.27)
Globulin, Total: 2.4 g/dL (ref 1.5–4.5)
Glucose: 84 mg/dL (ref 70–99)
Potassium: 4.3 mmol/L (ref 3.5–5.2)
Sodium: 142 mmol/L (ref 134–144)
Total Protein: 6.6 g/dL (ref 6.0–8.5)
eGFR: 99 mL/min/{1.73_m2} (ref >59)

## 2023-11-28 LAB — LIPID PANEL W/O CHOL/HDL RATIO
Cholesterol, Total: 212 mg/dL — ABNORMAL HIGH (ref 100–199)
HDL: 49 mg/dL (ref >39)
LDL Chol Calc (NIH): 139 mg/dL — ABNORMAL HIGH (ref 0–99)
Triglycerides: 133 mg/dL (ref 0–149)
VLDL Cholesterol Cal: 24 mg/dL (ref 5–40)

## 2023-11-28 LAB — BRAIN NATRIURETIC PEPTIDE: BNP: 114.2 pg/mL — ABNORMAL HIGH (ref 0.0–100.0)

## 2023-11-28 LAB — CBC WITH DIFFERENTIAL/PLATELET
Basophils Absolute: 0 10*3/uL (ref 0.0–0.2)
Basos: 0 %
EOS (ABSOLUTE): 0.3 10*3/uL (ref 0.0–0.4)
Eos: 3 %
Hematocrit: 43 % (ref 37.5–51.0)
Hemoglobin: 14.1 g/dL (ref 13.0–17.7)
Immature Grans (Abs): 0.1 10*3/uL (ref 0.0–0.1)
Immature Granulocytes: 1 %
Lymphocytes Absolute: 1.9 10*3/uL (ref 0.7–3.1)
Lymphs: 20 %
MCH: 28 pg (ref 26.6–33.0)
MCHC: 32.8 g/dL (ref 31.5–35.7)
MCV: 85 fL (ref 79–97)
Monocytes Absolute: 0.6 10*3/uL (ref 0.1–0.9)
Monocytes: 6 %
Neutrophils Absolute: 6.8 10*3/uL (ref 1.4–7.0)
Neutrophils: 70 %
Platelets: 234 10*3/uL (ref 150–450)
RBC: 5.04 x10E6/uL (ref 4.14–5.80)
RDW: 13.2 % (ref 11.6–15.4)
WBC: 9.7 10*3/uL (ref 3.4–10.8)

## 2023-11-28 LAB — HEPATITIS C ANTIBODY: Hep C Virus Ab: NONREACTIVE

## 2023-11-28 LAB — HIV ANTIBODY (ROUTINE TESTING W REFLEX): HIV Screen 4th Generation wRfx: NONREACTIVE

## 2023-11-28 LAB — PSA: Prostate Specific Ag, Serum: 0.7 ng/mL (ref 0.0–4.0)

## 2023-12-03 DIAGNOSIS — Z1211 Encounter for screening for malignant neoplasm of colon: Secondary | ICD-10-CM | POA: Diagnosis not present

## 2023-12-04 ENCOUNTER — Other Ambulatory Visit: Payer: Self-pay | Admitting: Family Medicine

## 2023-12-04 DIAGNOSIS — R7989 Other specified abnormal findings of blood chemistry: Secondary | ICD-10-CM

## 2023-12-04 DIAGNOSIS — R600 Localized edema: Secondary | ICD-10-CM

## 2023-12-09 LAB — COLOGUARD: COLOGUARD: NEGATIVE

## 2023-12-18 ENCOUNTER — Telehealth: Payer: Self-pay

## 2023-12-18 NOTE — Telephone Encounter (Signed)
 Copied from CRM [id number]. Topic: Clinical - Lab/Test Results >> Dec 17, 2023 12:29 PM Everette C wrote: Reason for CRM: The patient would like to please be contacted by K. Kaylla Cobos CMA when possible to go over their labs from 11/26/23 once more, when available

## 2024-11-28 ENCOUNTER — Encounter: Admitting: Family Medicine
